# Patient Record
Sex: Male | Born: 1955 | Race: Black or African American | Hispanic: No | Marital: Married | Smoking: Never smoker
Health system: Southern US, Community
[De-identification: ages and names within clinical notes are randomized; demographics above are authoritative.]

## PROBLEM LIST (undated history)

## (undated) DIAGNOSIS — I10 Essential (primary) hypertension: Secondary | ICD-10-CM

## (undated) DIAGNOSIS — G4730 Sleep apnea, unspecified: Secondary | ICD-10-CM

## (undated) HISTORY — DX: Sleep apnea, unspecified: G47.30

---

## 1991-01-10 HISTORY — PX: APPENDECTOMY: SHX54

## 2002-02-28 ENCOUNTER — Emergency Department (HOSPITAL_COMMUNITY): Admission: EM | Admit: 2002-02-28 | Discharge: 2002-02-28 | Payer: Self-pay | Admitting: Emergency Medicine

## 2002-02-28 ENCOUNTER — Encounter: Payer: Self-pay | Admitting: Emergency Medicine

## 2002-09-13 ENCOUNTER — Emergency Department (HOSPITAL_COMMUNITY): Admission: EM | Admit: 2002-09-13 | Discharge: 2002-09-13 | Payer: Self-pay | Admitting: Emergency Medicine

## 2002-09-13 ENCOUNTER — Encounter: Payer: Self-pay | Admitting: Emergency Medicine

## 2004-02-01 ENCOUNTER — Observation Stay (HOSPITAL_COMMUNITY): Admission: EM | Admit: 2004-02-01 | Discharge: 2004-02-02 | Payer: Self-pay | Admitting: Emergency Medicine

## 2004-02-02 ENCOUNTER — Encounter (INDEPENDENT_AMBULATORY_CARE_PROVIDER_SITE_OTHER): Payer: Self-pay | Admitting: Cardiology

## 2005-06-19 ENCOUNTER — Emergency Department (HOSPITAL_COMMUNITY): Admission: EM | Admit: 2005-06-19 | Discharge: 2005-06-19 | Payer: Self-pay | Admitting: Emergency Medicine

## 2005-07-06 ENCOUNTER — Encounter: Admission: RE | Admit: 2005-07-06 | Discharge: 2005-07-06 | Payer: Self-pay | Admitting: Rheumatology

## 2007-10-25 ENCOUNTER — Inpatient Hospital Stay (HOSPITAL_COMMUNITY): Admission: EM | Admit: 2007-10-25 | Discharge: 2007-10-31 | Payer: Self-pay | Admitting: Emergency Medicine

## 2007-10-26 ENCOUNTER — Ambulatory Visit: Payer: Self-pay | Admitting: Internal Medicine

## 2007-11-11 ENCOUNTER — Inpatient Hospital Stay (HOSPITAL_COMMUNITY): Admission: EM | Admit: 2007-11-11 | Discharge: 2007-11-16 | Payer: Self-pay | Admitting: Emergency Medicine

## 2007-11-15 ENCOUNTER — Encounter (INDEPENDENT_AMBULATORY_CARE_PROVIDER_SITE_OTHER): Payer: Self-pay | Admitting: General Surgery

## 2008-01-10 HISTORY — PX: CHOLECYSTECTOMY: SHX55

## 2009-07-16 ENCOUNTER — Encounter (HOSPITAL_COMMUNITY): Admission: RE | Admit: 2009-07-16 | Discharge: 2009-09-15 | Payer: Self-pay | Admitting: Internal Medicine

## 2010-01-24 ENCOUNTER — Emergency Department (HOSPITAL_COMMUNITY)
Admission: EM | Admit: 2010-01-24 | Discharge: 2010-01-24 | Payer: Self-pay | Source: Home / Self Care | Admitting: Emergency Medicine

## 2010-05-24 NOTE — Op Note (Signed)
Jerome Pacheco, Jerome Pacheco                ACCOUNT NO.:  [id number]   MEDICAL RECORD NO.:  [id number]          PATIENT TYPE:  INP   LOCATION:  1521                         FACILITY:  [facility]   PHYSICIAN:  Wilhemina Bonito. Marina Goodell, MD      DATE OF BIRTH:  12/15/1955   DATE OF PROCEDURE:  10/27/2007  DATE OF DISCHARGE:                               OPERATIVE REPORT   PROCEDURE:  Endoscopic retrograde cholangiography.   INDICATION:  Biliary pancreatitis, question retained common bile duct  stone.   HISTORY:  This is a 55 year old gentleman who was admitted to the  hospital several days ago with acute abdominal pain.  Imaging revealed  gallstones.  Over the course of a few days, his liver tests rose and he  developed abdominal pain.  GI was consulted yesterday for possible ERCP.  After the consultation, blood work was obtained revealing markedly  elevated amylase and lipase compared to normal levels on admission.  He  was felt to have biliary pancreatitis.  It was not clear at that point  whether the patient had passed a stone or had retained stone.  Repeat  laboratories from this morning were improved though still abnormal with  a total bilirubin 3.7.  He is now for ERCP with possible sphincterotomy  and stone extraction.  The nature of  the procedure as well as the  risks, benefits, alternatives were reviewed several times with the  patient.  He understood and agreed to proceed.   PROCEDURE:  After informed consent was obtained, the patient was sedated  over the course of the exam with 100 mcg of fentanyl, 8 mg of Versed and  37.5 mg of Benadryl IV.  Glucagon 0.5 mg was given as a duodenal  relaxant.  Preoperative antibiotics in the form of the Unasyn were  continued.  The Pentax side-viewing endoscope was then passed blindly  into the esophagus.  The stomach was partially examined and appeared  unremarkable.  The duodenal bulb was unremarkable.  The postbulbar  duodenum was unremarkable.  The major  ampulla appeared somewhat bulbous  though it was not clear that there was an impacted stone.  Scout  radiograph of the abdomen with the endoscope was obtained.  No  significant abnormalities.  Thereafter, the ampulla was cannulated.  Throughout the course of the exam, the patient was combative.  With  increasing sedation, he had transient hypoxemia.  Multiple attempts were  made to cannulate the common bile duct.  However, due to a combination  of combativeness and difficulty with sedation, it was elected to abort  the case in favor of repeating the exam with propofol and general  anesthesia assistance.   IMPRESSION:  1. Biliary pancreatitis.  2. Cholelithiasis.  3. Question of retained common bile duct stone.  4. Aborted ERCP secondary to patient combativeness and difficulty with      sedation.   RECOMMENDATIONS:  1. Continue IV fluids and n.p.o.  2. Continue IV antibiotics.  3. Arrange for anesthesia assisted ERCP to be repeated in the morning.      Wilhemina Bonito. Marina Goodell, MD  Electronically  Signed     JNP/MEDQ  D:  10/27/2007  T:  10/27/2007  Job:  [id number]   cc:   Alfonse Ras, MD  [address], [address]  [place]  [place] [address]   Candyce Churn. Allyne Gee, M.D.  Fax: [phone]

## 2010-05-24 NOTE — H&P (Signed)
Jerome Pacheco, Jerome Pacheco                ACCOUNT NO.:  [id number]   MEDICAL RECORD NO.:  [id number]          PATIENT TYPE:  INP   LOCATION:  1535                         FACILITY:  [facility]   PHYSICIAN:  Thornton Park. Daphine Deutscher, MD  DATE OF BIRTH:  02-02-1955   DATE OF ADMISSION:  11/11/2007  DATE OF DISCHARGE:                              HISTORY & PHYSICAL   CHIEF COMPLAINT:  Recurrent abdominal pain consistent with biliary  pancreatitis.   HISTORY:  Mr. Masini is a 55 year old African American male who was  originally in pretty good health until about 3 weeks ago when he was  admitted to the [facility] ER with a bout that proved to be biliary  pancreatitis.  He went home and was in the process of cooling off,  awaiting for an elective cholecystectomy, and yesterday began having  recurrent abdominal pain that has been unremitting.  He presented to the  emergency room tonight where Dr. Weldon Inches evaluated him and obtained a  lipase which is greater than 2000 and liver function studies which are  significant in that his bilirubin is 5.1, alkaline phosphatase is 197,  SGPT is 277, SGOT of 276.  His white count is nonelevated at 6800 with  hemoglobin of 13 and no increase in his absolute granulocytes.   PAST MEDICAL HISTORY:  He has underlying hypertension.   PREVIOUS SURGERY:  Appendectomy.   SOCIAL HISTORY:  His wife accompanies him, and he is seen here in the ER  in the hall where my exam is limited via the lack of privacy.  The  patient does not drink alcohol or use drugs.   Has no known allergies.   PHYSICAL EXAMINATION:  Blood pressure 132/109, pulse rate of 100,  respirations 20, afebrile.  HEAD:  Normocephalic.  EYES:  Sclerae are somewhat muddy, but slight icterus.  HEENT:  Otherwise, unremarkable.  NECK:  Supple.  CHEST:  Clear.  HEART:  Sinus tachycardia.  ABDOMEN:  Tenderness, but no guarding.  The patient has been medicated.  Bowel sounds diminished.  EXTREMITIES:  Full  range of motion.   Gallbladder ultrasound showed gallstones, but with some wall thickening  which is elusive in the context of underlying pancreatitis.   PLAN:  Admit for IV hydration, n.p.o., and probable antibiotic coverage.  Possibly ordering an MRCP if needed.  The patient will be admitted to  Dr. Verdie Shire service to provide continuity since she took care of him in  his previous admission.      Thornton Park Daphine Deutscher, MD  Electronically Signed     MBM/MEDQ  D:  11/11/2007  T:  11/12/2007  Job:  [id number]

## 2010-05-24 NOTE — H&P (Signed)
NAMEKIZER, NOBBE                ACCOUNT NO.:  [id number]   MEDICAL RECORD NO.:  [id number]          PATIENT TYPE:  INP   LOCATION:  0102                         FACILITY:  [facility]   PHYSICIAN:  Almond Lint, MD       DATE OF BIRTH:  03/06/1955   DATE OF ADMISSION:  10/24/2007  DATE OF DISCHARGE:                              HISTORY & PHYSICAL   REFERRING PHYSICIAN:  Billee Cashing, M.D. in the ED.   CHIEF COMPLAINT:  Abdominal pain.   HISTORY OF PRESENT ILLNESS:  Mr. Lapaglia is a 55 year old male who was  awakened from sleep with abdominal pain.  This originally was in the  epigastric region.  He felt like he had to have a bowel movement, and  this was gas pain, but attempted to do so to no avail.  He denies  nausea.  He describes the pain as aching and cramping.  The pain did  start to shift towards the right upper quadrant as well as the  epigastrium.  Once he got to the emergency department, the pain began to  subside.  This was associated with some narcotics.  He is currently  painfree.   REVIEW OF SYSTEMS:  Otherwise negative x11 systems.   PAST MEDICAL HISTORY:  Significant for hypertension.   PAST SURGICAL HISTORY:  Appendectomy.   SOCIAL HISTORY:  He is accompanied by his wife and does not smoke, drink  alcohol, or use drugs.   MEDICATIONS:  None.   ALLERGIES:  None.   FAMILY HISTORY:  No significant medical problems that he is aware of.   PHYSICAL EXAMINATION:  Temperature 97.3, pulse 67, blood pressure  146/86, respiratory rate 18.  He is alert and oriented x3 in no acute distress.  HEENT:  Normocephalic and atraumatic.  NECK:  Supple with no lymphadenopathy.  HEART:  Regular.  ABDOMEN:  Soft, nontender, nondistended.  EXTREMITIES:  Warm and well perfused.   LABS:  Potassium is low at 2.9.  LFTs are elevated with bilirubin being  2.2.  AST and ALT elevated at 95 and 58.  Alk phos is 114.  Lipase is  34.  CBC:  White count is 5.4.  H&H 13.3 and 38.6.  UA  is negative.   Ultrasound report is reviewed and demonstrates a gallbladder with  cholelithiasis and gallbladder sludge.  The largest gallstone is 10 mm  in diameter.  There is no choledocholithiasis and no common bile duct  dilatation seen.   ASSESSMENT:  Mr. Perleberg is a 55 year old male with symptomatic  cholelithiasis and possible choledocholithiasis.  Labs were drawn at  11:30 on the 15th, so we will redraw labs in approximately 12 hours.  If  his LFTs are going up, we will refer him to GI medicine for an ERCP.  If  they are going down and he continues to be painfree, we will let him  eat, and if he is able to tolerate p.o., follow up with him for  outpatient cholecystectomy.  Patient understands the plan.      Almond Lint, MD  Electronically Signed  FB/MEDQ  D:  10/25/2007  T:  10/25/2007  Job:  [id number]

## 2010-05-24 NOTE — Op Note (Signed)
NAMEALEXIE, Jerome Pacheco                ACCOUNT NO.:  [id number]   MEDICAL RECORD NO.:  [id number]          PATIENT TYPE:  INP   LOCATION:  1535                         FACILITY:  [facility]   PHYSICIAN:  Almond Lint, MD       DATE OF BIRTH:  [birth year]   DATE OF PROCEDURE:  11/15/2007  DATE OF DISCHARGE:                               OPERATIVE REPORT   PREOPERATIVE DIAGNOSIS:  Gallstone pancreatitis and questionable  choledocholithiasis.   POSTOPERATIVE DIAGNOSIS:  Gallstone pancreatitis and questionable  choledocholithiasis.   PROCEDURE:  Laparoscopic cholecystectomy with attempted cholangiogram.   SURGEON:  Almond Lint, MD   ASSISTANT:  Alfonse Ras, MD   ANESTHESIA:  General and local.   FINDINGS:  Friable, inflamed cystic duct that tore with clip placement.   SPECIMEN:  Gallbladder to pathology.   ESTIMATED BLOOD LOSS:  25 mL.   COMPLICATIONS:  Cystic duct torn with clip placement, unable to perform  cholangiogram.   PROCEDURE IN DETAIL:  Jerome Pacheco was identified in the holding area and  taken to the operating room where he was placed supine on the operating  room table.  General anesthesia was induced.  His abdomen was prepped  and draped in a sterile fashion.  Time-out was performed according to  the surgical safety check list.  When all was correct we commenced the  procedure.  The infraumbilical skin was anesthetized with a mixture of  1% lidocaine plain and 0.25% Marcaine with epinephrine.  A transverse  curvilinear incision was made with a #11 blade.  The subcutaneous skin  was divided with the Kelly clamps and the umbilical stalk elevated with  a Kocher.  The fascia was grasped on either side of the midline with  Kocher clamps and entered sharply with the 11 blade.  A zero Vicryl was  placed in pursestring fashion around the fascial incision.  Hasson  trocar was advanced into the abdomen and pneumoperitoneum was achieved  to a pressure of 15 mmHg.  The patient  was placed into reverse  Trendelenburg and rotated to the left.  The gallbladder had a bit of  omentum stuck to it.  The epigastrium was examined and appropriate site  for port was placed.  Local anesthesia was used to numb the peritoneum  and the skin and an approximately 12 mm incision was made in the  epigastrium.  An 11 mm trocar was passed through abdominal wall under  direct visualization.  Two 5 mm ports were placed in the right upper  quadrant in a similar fashion.  The omentum was dissected off the  gallbladder and the liver edge.  The liver capsule tore somewhat but  there was no significant bleeding.  This was coagulated.  A grasper was  placed onto the fundus of the gallbladder and the gallbladder elevated  toward the head.  The infundibulum was grasped and retracted laterally.  [place] dissector was used to strip peritoneum from the infundibulum.  The cystic artery was immediately seen anteriorly.  This was dissected  free and entered high up on the gallbladder.  This was clipped  twice on  the patient's side, once on the specimen side.  The cystic duct was also  seen and this was skeletonized.  The peritoneum was skeletonized all the  way up halfway up the gallbladder.  The cystic duct was dilated but was  directly entering the gallbladder.  While placing a clip on the  gallbladder side in order to perform cholangiogram the cystic duct tore.  A clip was placed on the gallbladder to prevent significant biliary  leakage.  The cholangiogram catheter was advanced through the abdominal  wall and was attempted to be placed into the cystic duct stump.  This  would not pass.  There were two stones that were extracted from the  duct.  Still the cystic duct was unable to be cannulated.  Dr. Colin Benton  assisted with this portion and again it was felt that the continued  attempts were disturbing the integrity of the cystic duct stump.  An  Endoloop was used to ligate the cystic duct at the  junction of the  common bile duct.  Two clips were then placed distal to this suture and  the Endoloop clipped.  The gallbladder was then removed from the  gallbladder fossa with Bovie electrocautery.  The camera was moved to  the epigastric port and the EndoCatch bag was used to remove the  gallbladder from the umbilical port.  The Kelly clamp had to be used to  dilate the fascial incision just slightly to allow the gallbladder to  escape.  The gallbladder fossa and suprahepatic regions were irrigated  copiously.  Irrigant returned clear.  The gallbladder fossa was examined  as well as the sites of the clips on the cystic artery and cystic duct.  There was no biliary leakage seen and no bleeding.  Two liters of saline  were used to irrigate.  The epigastric and right upper quadrant ports  were removed under direct visualization and then pneumoperitoneum was  allowed to escape through the Hasson trocar.  Once pneumoperitoneum was  evacuated the pursestring suture at the umbilicus was used to close the  umbilical fascia.  This was palpated and there was no fascial defect  found.  The skin was then closed with a running Monocryl 4-0 in a  subcuticular fashion.  The skin was cleaned, dried and dressed with  Dermabond.  The patient was awakened from anesthesia and taken from the  operating room in stable condition.      Almond Lint, MD  Electronically Signed     FB/MEDQ  D:  11/15/2007  T:  11/15/2007  Job:  [id number]

## 2010-05-27 NOTE — Discharge Summary (Signed)
NAMEDEAN, Jerome Pacheco                ACCOUNT NO.:  [id number]   MEDICAL RECORD NO.:  [id number]          PATIENT TYPE:  INP   LOCATION:  6524                         FACILITY:  [facility]   PHYSICIAN:  Darden Palmer., M.D.DATE OF BIRTH:  07/04/1955   DATE OF ADMISSION:  DATE OF DISCHARGE:  02/02/2004                                 DISCHARGE SUMMARY   FINAL DIAGNOSES:  1.  Accelerated hypertension.  2.  Headache, resolved.  3.  Chest pain with atypical features, myocardial infarction ruled out.   PROCEDURES:  Echocardiogram.   HISTORY:  The patient was seen as an unassigned patient out of the emergency  room.  He has a history of hypertension, but does not take his medicine on a  regular basis.  His blood pressure was checked at his work site because he  was feeling poorly with headache, and found to be 156/101. He took medicine  and went home, but continued to have a headache, and eventually called Dr.  Allyne Gee' office, and eventually came to the emergency room. His blood  pressure was found to be 162/114.  He complained of some vague indigestion-  type pain like he had reflux.  The emergency room physician interpreted this  as cardiac pain, and gave him nitroglycerin.  The symptoms seemed to go  away.  He has no prior history of chest pain.  He states he had a stress  test within the past year.  He was admitted to the hospital to rule out a  myocardial infarction to the cardiology service.  Please see the previously  dictated history and physical for remainder of the details.   HOSPITAL COURSE:  Hemoglobin was 13.6, hematocrit 38.9, white count was  6000.  Chemistry panel showed a sodium of 137, potassium 3, chloride 102,  CO2 28, BUN 14, creatinine 1, glucose of 126.  Liver enzymes showed  elevation of alkaline phosphatase to 192.  Total CPK was elevated at 473  with MB of 4.9, troponin I was negative at 0.01.  This is thought to be  mostly due to muscle.  Lipid panel results  were pending at the time of  dictation.  TSH was also pending at the time of dictation.   The patient was admitted and begun on heparin, hypertension, and given  clonidine and his blood pressure came down. He had no further symptoms  following that.  When seen on rounds the next morning, he was symptom free,  and his blood pressure was better controlled.   He was given additional potassium for supplementation, and had an  echocardiogram showing concentric left ventricular hypertrophy with normal  systolic function.  Left atrium was borderline elevated.   He was placed on Norvasc.  He had improvement in his blood pressure, and I  felt he could be discharged home.  His EKG showed some T wave inversions in  the lateral leads which are nonspecific and could be consistent with LVH,  but also could be consistent with ischemia.  It is recommended that he be  discharged home.  The importance of good blood  pressure control is discussed  with him, including the need to take the medicines on a regular basis, as  well as the long-term risks of hypertension.  We specifically discussed the  blood pressure goal of 130/80.  He was instructed to monitor his blood  pressure at home.   DISCHARGE MEDICATIONS:  1.  Lisinopril 10 mg daily.  2.  HCTZ 12.5 mg daily.  3.  K-Dur 20 mEq daily.  4.  Aspirin 81 mg daily.  5.  Nitroglycerin sublingual p.r.n.  6.  Norvasc 5 mg daily.   DISCHARGE INSTRUCTIONS:  He is instructed to follow up with Dr. Candyce Churn.  Allyne Gee and is to also call my office to get an appointment for a stress  Cardiolite test to be done as an outpatient.  He is instructed to return to  the emergency room if he has recurrent chest pain.      WST/MEDQ  D:  02/02/2004  T:  02/03/2004  Job:  [id number]   cc:   Candyce Churn. Allyne Gee, M.D.  [address]  [address]  [place]  [place] [address]  Fax: [phone]

## 2010-05-27 NOTE — H&P (Signed)
NAMENASHON, ERBES                ACCOUNT NO.:  [id number]   MEDICAL RECORD NO.:  [id number]          PATIENT TYPE:  EMS   LOCATION:  MINO                         FACILITY:  [facility]   PHYSICIAN:  Ulyses Amor, MD DATE OF BIRTH:  09/03/1955   DATE OF ADMISSION:  02/01/2004  DATE OF DISCHARGE:                                HISTORY & PHYSICAL   REASON FOR ADMISSION:  Jerome Pacheco is a 55 year old black man who is  admitted to [facility] for further evaluation of chest  pain.   HISTORY OF PRESENT ILLNESS:  The patient initially presented to the  emergency department with a complaint of headache.  This began while he was  at work today.  He has a history of hypertension but has not been taking his  medication regularly.  His blood pressure was checked on site and was found  to be 156/101.  He took his medication and went home.  He continued to  experience a headache during the course of the afternoon and ultimately  presented to the emergency department.  Here, his blood pressure was found  to be 162/114.  While he was in the emergency department, he experienced an  episode of chest pain.  This was described as a substernal burning and  indigestion.  It did not radiate.  It was associated with nausea and  diaphoresis but no dyspnea.  There were no exacerbating or ameliorating  factors.  It appeared not to be related to position, activity, meals, or  respirations.  It was ultimately relieved in the emergency department with  one nitroglycerin tablet.  The total duration of chest pain was  approximately 10 minutes.   PAST MEDICAL HISTORY:  The patient has no past history of cardiac disease  including no history of chest pain, myocardial infarction, coronary artery  disease, congestive heart failure, or arrhythmia.  He has a number of risk  factors for coronary artery disease including hypertension, and a family  history of early coronary artery disease (his father  died of a myocardial  infarction at age 54).  There is no history of dyslipidemia or diabetes  mellitus.  There is no history of smoking.  The patient's headache has also  improved since presentation to the emergency department and reduction of his  blood pressure.   The patient has no other medical problems.   MEDICATIONS:  Lisinopril/hydrochlorothiazide 10/12.5 mg p.o. on an irregular  basis.   ALLERGIES:  None.   PAST SURGICAL HISTORY:  Appendectomy.  Significant injuries are none.   FAMILY HISTORY:  His father died at age 32 of a myocardial infarction as  described above.  His mother died in her 30s of unknown cancer.  He has six  siblings-one of his brothers has suffered a cerebrovascular accident.  There  were no other medical problems amongst his siblings.   SOCIAL HISTORY:  The patient lives with his wife.  He has two children.  He  works as an Airline pilot.  He does not smoke.  He drinks approximately one 12-  pack of beer per weekend.  REVIEW OF SYMPTOMS:  Reveals no problems related to his head, eye, nose,  mouth, throat, lungs, gastrointestinal system, genitourinary system, or  extremities.  There is no history of neurologic or psychiatric disorder.  There is no history of fever, chills, or weight loss.   PHYSICAL EXAMINATION:  VITAL SIGNS:  Blood pressure initially 116/114.  Most  recent blood pressure in the emergency department was 103/63.  Pulse  initially 91 and regular, now 74 and regular.  Respirations 20.  Temperature  98.4.  GENERAL:  The patient was an obese, middle-aged black man in no discomfort.  He was alert, oriented, appropriate, and responsive.  HEENT:  Normal.  Funduscopic examination revealed sharp disks.  NECK:  Without thyromegaly or adenopathy.  Carotid pulses were palpable  bilaterally without bruits.  CARDIOVASCULAR:  Normal S1 and S2.  There was no S3, S4, murmur, rub, or  click.  Cardiac rhythm was regular.  No chest wall tenderness was  noted.  LUNGS:  Clear.  ABDOMEN:  Soft and nontender.  There was no mass, hepatosplenomegaly, bruit,  distention, rebound, guarding, or rigidity.  Bowel sounds were normal.  RECTAL:  Not performed as they were not pertinent to the reason for acute  care hospitalization.  GENITAL:  Not performed as they were not pertinent to the reason for acute  care hospitalization.  EXTREMITIES:  Without edema, deviation or deformity.  Radial and dorsalis  pedis pulses were palpable bilaterally.  NEUROLOGICAL:  Brief screening neurologic survey was unremarkable.   LABORATORY DATA:  The electrocardiogram revealed mild T-wave inversion in  the inferolateral leads.  There were no other changes specific for ischemia  or infarction. The chest radiograph, according to the radiologist,  demonstrated cardiac enlargement and a tortuous aorta.   The initial set of cardiac markers revealed a CK-MB of 4.1, myoglobin of  180, troponin less than 0.05.  The second set revealed a CK-MB of 2.7,  myoglobin of 128 and troponin less than 0.05.  The third set revealed a CK-  MB of 3.5, myoglobin 106, and troponin less than 0.05.  Potassium is 3.0,  BUN 14, and creatinine 1.0.  White count was 8.4, with a hemoglobin of 15.9,  and hematocrit of 45.1.  The remaining studies were pending at the time of  this dictation.   IMPRESSION:  1.  Chest pain:  Rule out coronary artery disease.  The chest pain,      experienced while in the emergency department, was described as a      substernal burning or an indigestion.  It was relieved with      nitroglycerin.  Electrocardiogram revealed mild inferolateral T-wave      inversion.  2.  Hypertension:  Poorly controlled.  Noncompliance with blood pressure      medications.  This is the likely etiology of his presenting headache.   PLAN:  1.  Telemetry.  2.  Serial cardiac enzymes.  3.  Aspirin.  4.  Intravenous heparin. 5.  Intravenous nitroglycerin.  6.  Protonix.  7.   Restart blood pressure medications.  8.  Metoprolol.  9.  Fasting lipid profile.  10. Further measures per Dr. Lacretia Nicks. Viann Fish.      MSC/MEDQ  D:  02/01/2004  T:  02/01/2004  Job:  [id number]   cc:   Darden Palmer., M.D.  [address], [address]  [place]  [place] [address]  Fax: [phone]

## 2010-05-27 NOTE — Discharge Summary (Signed)
Jerome Pacheco, Jerome Pacheco                ACCOUNT NO.:  [id number]   MEDICAL RECORD NO.:  [id number]          PATIENT TYPE:  INP   LOCATION:  1535                         FACILITY:  [facility]   PHYSICIAN:  Almond Lint, MD       DATE OF BIRTH:  03-Oct-1955   DATE OF ADMISSION:  11/11/2007  DATE OF DISCHARGE:  11/16/2007                               DISCHARGE SUMMARY   PRINCIPAL DIAGNOSIS:  Cholecystitis and gallstone pancreatitis.   SECONDARY DIAGNOSES:  1. Choledocholithiasis.  2. Hypertension.   Principal Procedure:  Laparoscopic Cholecystectomy   HOSPITAL COURSE:  Mr. Tobler is a 55 year old male who was admitted on  November 11, 2007 for recurrent biliary pancreatitis.  He had been  scheduled for an office visit but did not make it to that time and had  recurrent severe abdominal pain, nausea and vomiting.  He was made NPO  and a time was found later that week for a cholecystectomy.  When he  went to the operating room for cholecystectomy he was pain-free.  In the  operating room he had a very friable cystic duct that tore when placing  a metal clip for the cholangiogram.  The  cystic duct was clearly  identified and clipped, however.  There did appear to be stone fragments  in the duct.  He postoperatively bumped his liver enzymes and  GI/Medicine was consulted.  He was a difficult attempt for ERCP the  previous time and so they elected, prior to performing an ERCP, to  perform an MRCP and to recheck his liver enzymes.  The next morning his  liver enzymes  came back down and  he did not get the MRCP due to  difficulties with timing in the MRI suite.  He was discharged in  improved condition.  He will follow with me in one to two weeks.   Discharge medications:  He was discharged to home with Clonidine Patch and Micardis 80/25.  He  also was given Percocet.      Almond Lint, MD  Electronically Signed     FB/MEDQ  D:  12/09/2007  T:  12/09/2007  Job:  [id number]

## 2010-05-27 NOTE — Discharge Summary (Signed)
NAMEKALMEN, LOLLAR                ACCOUNT NO.:  [id number]   MEDICAL RECORD NO.:  [id number]          PATIENT TYPE:  INP   LOCATION:  1521                         FACILITY:  [facility]   PHYSICIAN:  Almond Lint, MD       DATE OF BIRTH:  April 30, 1955   DATE OF ADMISSION:  10/24/2007  DATE OF DISCHARGE:  10/31/2007                               DISCHARGE SUMMARY   PRINCIPAL DIAGNOSIS:  Gallstone pancreatitis.   OTHER DIAGNOSES INCLUDE:  1. Hypertension.  2. Cholelithiasis.  3. Hypokalemia.  4. Nausea and vomiting.   PRINCIPAL PROCEDURES:  Attempted ERCP.   HOSPITAL COURSE:  Mr. Sweetin was seen in the emergency department for  pain that woke him from sleep.  He appeared to have choledocholithiasis  at this time, as his bilirubin was elevated to 2.2.  He did not have any  evidence of leukocytosis at this time or pancreatitis.  He was admitted  to recheck his LFTs.  These were elevated and GI medicine was consulted.  They attempted an ERCP; however, he was unable to tolerate it under  sedation.  His LFTs continued to go up and he underwent MRCP, which was  negative.  He wished [place] to undergo repeat attempts at ERCP under general  anesthesia.  His LFTs went up and additionally, he developed  pancreatitis on hospital day 3.  Despite the fact that this occurred,  his pancreatitis began to slowly improve.  He did not require an  additional ERCP because his LFTs began to decrease.  He was tolerating a  low-fat diet prior to discharge.  He was scheduled to follow up with me  in two to three weeks after being discharged.   HOME MEDICATIONS:  1. Micardis/hydrochlorothiazide 80/25 once a day.  2. Discharge medicines also include Vicodin one to two tabs p.o. every      8 hours p.r.n. pain.      Almond Lint, MD  Electronically Signed     FB/MEDQ  D:  11/22/2007  T:  11/22/2007  Job:  [id number]

## 2010-10-11 LAB — COMPREHENSIVE METABOLIC PANEL
ALT: 188 — ABNORMAL HIGH
ALT: 198 — ABNORMAL HIGH
ALT: 324 — ABNORMAL HIGH
ALT: 126 — ABNORMAL HIGH
AST: 21
AST: 258 — ABNORMAL HIGH
AST: 95 — ABNORMAL HIGH
AST: 276 — ABNORMAL HIGH
AST: 47 — ABNORMAL HIGH
Albumin: 3.1 — ABNORMAL LOW
Albumin: 3.6
Albumin: 3.7
Albumin: 3.4 — ABNORMAL LOW
Albumin: 3.8
Alkaline Phosphatase: 114
Alkaline Phosphatase: 129 — ABNORMAL HIGH
Alkaline Phosphatase: 139 — ABNORMAL HIGH
Alkaline Phosphatase: 158 — ABNORMAL HIGH
Alkaline Phosphatase: 183 — ABNORMAL HIGH
Alkaline Phosphatase: 218 — ABNORMAL HIGH
BUN: 4 — ABNORMAL LOW
BUN: 7
BUN: 8
BUN: 10
BUN: 12
BUN: 5 — ABNORMAL LOW
CO2: 24
CO2: 25
CO2: 26
CO2: 26
CO2: 25
CO2: 27
CO2: 27
CO2: 27
Calcium: 8.8
Calcium: 8.9
Calcium: 9
Calcium: 9.1
Calcium: 9.3
Chloride: 103
Chloride: 104
Chloride: 108
Chloride: 110
Chloride: 104
Chloride: 106
Chloride: 107
Creatinine, Ser: 1.02
Creatinine, Ser: 1.17
Creatinine, Ser: 1.02
Creatinine, Ser: 1.06
GFR calc Af Amer: >60
GFR calc Af Amer: >60
GFR calc Af Amer: >60
GFR calc Af Amer: >60
GFR calc Af Amer: >60
GFR calc Af Amer: >60
GFR calc Af Amer: >60
GFR calc non Af Amer: >60
GFR calc non Af Amer: >60
GFR calc non Af Amer: >60
GFR calc non Af Amer: >60
GFR calc non Af Amer: >60
GFR calc non Af Amer: >60
Glucose, Bld: 108 — ABNORMAL HIGH
Glucose, Bld: 111 — ABNORMAL HIGH
Glucose, Bld: 114 — ABNORMAL HIGH
Glucose, Bld: 120 — ABNORMAL HIGH
Glucose, Bld: 123 — ABNORMAL HIGH
Glucose, Bld: 129 — ABNORMAL HIGH
Glucose, Bld: 102 — ABNORMAL HIGH
Glucose, Bld: 107 — ABNORMAL HIGH
Potassium: 3.3 — ABNORMAL LOW
Potassium: 3.4 — ABNORMAL LOW
Potassium: 3.4 — ABNORMAL LOW
Potassium: 3.1 — ABNORMAL LOW
Potassium: 3.2 — ABNORMAL LOW
Potassium: 3.3 — ABNORMAL LOW
Sodium: 137
Sodium: 138
Sodium: 140
Sodium: 140
Sodium: 141
Sodium: 138
Total Bilirubin: 2.6 — ABNORMAL HIGH
Total Bilirubin: 3.4 — ABNORMAL HIGH
Total Bilirubin: 4.6 — ABNORMAL HIGH
Total Bilirubin: 1.3 — ABNORMAL HIGH
Total Bilirubin: 2.2 — ABNORMAL HIGH
Total Bilirubin: 5.1 — ABNORMAL HIGH
Total Protein: 5.8 — ABNORMAL LOW
Total Protein: 6
Total Protein: 6
Total Protein: 6.1
Total Protein: 6.2
Total Protein: 6.4
Total Protein: 6

## 2010-10-11 LAB — CBC
HCT: 36.6 — ABNORMAL LOW
HCT: 36.9 — ABNORMAL LOW
HCT: 36.9 — ABNORMAL LOW
HCT: 37.2 — ABNORMAL LOW
HCT: 34.9 — ABNORMAL LOW
HCT: 38 — ABNORMAL LOW
Hemoglobin: 12.4 — ABNORMAL LOW
Hemoglobin: 12.5 — ABNORMAL LOW
Hemoglobin: 12.6 — ABNORMAL LOW
Hemoglobin: 11.9 — ABNORMAL LOW
Hemoglobin: 12.2 — ABNORMAL LOW
MCHC: 34
MCHC: 34.3
MCHC: 34.8
MCHC: 34.3
MCHC: 34.4
MCV: 91
MCV: 91.6
MCV: 93.7
MCV: 90.3
MCV: 90.4
Platelets: 173
Platelets: 260
Platelets: 306
RBC: 3.94 — ABNORMAL LOW
RBC: 3.97 — ABNORMAL LOW
RBC: 3.97 — ABNORMAL LOW
RBC: 4.01 — ABNORMAL LOW
RBC: 4.12 — ABNORMAL LOW
RBC: 3.86 — ABNORMAL LOW
RBC: 3.86 — ABNORMAL LOW
RBC: 3.87 — ABNORMAL LOW
RDW: 13
RDW: 13.5
RDW: 13.6
RDW: 13.8
RDW: 13.9
RDW: 14
RDW: 13
RDW: 13
WBC: 3 — ABNORMAL LOW
WBC: 5.4
WBC: 5.8
WBC: 6.7
WBC: 7.3
WBC: 5.1

## 2010-10-11 LAB — HEPATIC FUNCTION PANEL
ALT: 262 — ABNORMAL HIGH
AST: 52 — ABNORMAL HIGH
Albumin: 3.4 — ABNORMAL LOW
Albumin: 2.9 — ABNORMAL LOW
Alkaline Phosphatase: 150 — ABNORMAL HIGH
Alkaline Phosphatase: 208 — ABNORMAL HIGH
Indirect Bilirubin: 1.4 — ABNORMAL HIGH
Total Bilirubin: 3.7 — ABNORMAL HIGH
Total Bilirubin: 1.6 — ABNORMAL HIGH
Total Protein: 5.9 — ABNORMAL LOW
Total Protein: 6.5

## 2010-10-11 LAB — URINALYSIS, ROUTINE W REFLEX MICROSCOPIC
Bilirubin Urine: NEGATIVE
Glucose, UA: NEGATIVE
Hgb urine dipstick: NEGATIVE
Protein, ur: NEGATIVE
Specific Gravity, Urine: 1.012
pH: 7

## 2010-10-11 LAB — DIFFERENTIAL
Basophils Absolute: 0
Basophils Absolute: 0
Basophils Relative: 0
Eosinophils Absolute: 0
Eosinophils Absolute: 0.1
Eosinophils Relative: 0
Eosinophils Relative: 1
Eosinophils Relative: 1
Lymphocytes Relative: 16
Lymphocytes Relative: 26
Lymphocytes Relative: 18
Lymphs Abs: 0.9
Lymphs Abs: 1.7
Lymphs Abs: 1.7
Lymphs Abs: 1.2
Monocytes Absolute: 0.3
Monocytes Relative: 7
Monocytes Relative: 9
Neutro Abs: 4.2
Neutro Abs: 3.1
Neutro Abs: 5
Neutrophils Relative %: 63
Neutrophils Relative %: 77

## 2010-10-11 LAB — PROTIME-INR
INR: 1.1
Prothrombin Time: 14

## 2010-10-11 LAB — LIPASE, BLOOD
Lipase: 124 — ABNORMAL HIGH
Lipase: 33
Lipase: 34
Lipase: 716 — ABNORMAL HIGH
Lipase: 92 — ABNORMAL HIGH
Lipase: 218 — ABNORMAL HIGH
Lipase: 38
Lipase: 431 — ABNORMAL HIGH
Lipase: >2000 — ABNORMAL HIGH

## 2010-10-11 LAB — AMYLASE
Amylase: 1402 — ABNORMAL HIGH
Amylase: 688 — ABNORMAL HIGH

## 2012-01-08 ENCOUNTER — Emergency Department (HOSPITAL_COMMUNITY)
Admission: EM | Admit: 2012-01-08 | Discharge: 2012-01-08 | Disposition: A | Discharge status: Home / Self Care | Attending: Emergency Medicine | Admitting: Emergency Medicine

## 2012-01-08 ENCOUNTER — Encounter (HOSPITAL_COMMUNITY): Payer: Self-pay | Admitting: *Deleted

## 2012-01-08 ENCOUNTER — Emergency Department (HOSPITAL_COMMUNITY)

## 2012-01-08 DIAGNOSIS — M25561 Pain in right knee: Secondary | ICD-10-CM

## 2012-01-08 DIAGNOSIS — Z79899 Other long term (current) drug therapy: Secondary | ICD-10-CM | POA: Insufficient documentation

## 2012-01-08 DIAGNOSIS — Z791 Long term (current) use of non-steroidal anti-inflammatories (NSAID): Secondary | ICD-10-CM | POA: Insufficient documentation

## 2012-01-08 DIAGNOSIS — I10 Essential (primary) hypertension: Secondary | ICD-10-CM | POA: Insufficient documentation

## 2012-01-08 DIAGNOSIS — M25569 Pain in unspecified knee: Secondary | ICD-10-CM | POA: Insufficient documentation

## 2012-01-08 HISTORY — DX: Essential (primary) hypertension: I10

## 2012-01-08 MED ORDER — OXYCODONE-ACETAMINOPHEN 5-325 MG PO TABS
2.0000 | ORAL_TABLET | Freq: Once | ORAL | Status: AC
  Administered 2012-01-08: 2 via ORAL
  Filled 2012-01-08: qty 2

## 2012-01-08 MED ORDER — OXYCODONE-ACETAMINOPHEN 5-325 MG PO TABS: 1.0000 | ORAL_TABLET | Freq: Four times a day (QID) | ORAL | Status: DC | PRN

## 2012-01-08 NOTE — ED Notes (Signed)
Pt states Friday he injured his R knee playing golf, unsure of exactly how he did it, states pain has worsened over the past couple days and now he can't bend it. Pt states has been putting heat on knee and taking motrin for pain.

## 2012-01-08 NOTE — ED Provider Notes (Signed)
History     CSN: [id number]  Arrival date & time 01/08/12  7829   First MD Initiated Contact with Patient 01/08/12 [phone]      Chief Complaint  Patient presents with  . Knee Pain    (Consider location/radiation/quality/duration/timing/severity/associated sxs/prior treatment) HPI Comments: Patient started with soreness in the knee three days ago, played golf two days ago and has worsened since that time.  It is now severe and he is having a hard time ambulating.  Patient is a 56 y.o. male presenting with knee pain. The history is provided by the patient.  Knee Pain This is a new problem. Episode onset: 2-3 days ago. The problem occurs constantly. The problem has been rapidly worsening. The symptoms are aggravated by walking (movement). The symptoms are relieved by rest. Treatments tried: nsaid, hydrocodone. The treatment provided no relief.    Past Medical History  Diagnosis Date  . Hypertension     Past Surgical History  Procedure Date  . Cholecystectomy   . Appendectomy     History reviewed. No pertinent family history.  History  Substance Use Topics  . Smoking status: Never Smoker   . Smokeless tobacco: Never Used  . Alcohol Use: No      Review of Systems  All other systems reviewed and are negative.    Allergies  Review of patient's allergies indicates no known allergies.  Home Medications   Current Outpatient Rx  Name  Route  Sig  Dispense  Refill  . AMLODIPINE BESYLATE 10 MG PO TABS   Oral   Take 10 mg by mouth at bedtime.         . IBUPROFEN 200 MG PO TABS   Oral   Take 800 mg by mouth every 8 (eight) hours as needed. For pain         . NABUMETONE 750 MG PO TABS   Oral   Take 750 mg by mouth 2 (two) times daily.         . TELMISARTAN-HCTZ 80-25 MG PO TABS   Oral   Take 1 tablet by mouth every morning.           BP 181/114  Pulse 95  Temp 97.7 F (36.5 C) (Oral)  Resp 24  SpO2 96%  Physical Exam  Nursing note and vitals  reviewed. Constitutional: He is oriented to person, place, and time. He appears well-developed and well-nourished. No distress.  HENT:  Head: Normocephalic and atraumatic.  Mouth/Throat: Oropharynx is clear and moist.  Neck: Normal range of motion. Neck supple.  Musculoskeletal:       The right knee is noted to have a small effusion.  There is ttp and pain with any range of motion.  It appears stable ap, lat to my exam that was limited by pain.  Neurological: He is alert and oriented to person, place, and time.  Skin: Skin is warm and dry. He is not diaphoretic.    ED Course  Procedures (including critical care time)  Labs Reviewed - No data to display No results found.   No diagnosis found.    MDM  The patient presents with right knee pain since playing golf.  There appears to be a small effusion present and the xray show pre-patellar soft tissue swelling.  He will be placed in a immobilizer, started on nsaids, pain meds, and follow up information for orthopedics will be given.          Geoffery Lyons, MD 01/08/12 [phone]

## 2012-01-08 NOTE — ED Notes (Signed)
Pt has not taken BP medication today, states he took some last night.

## 2012-01-08 NOTE — ED Notes (Signed)
Patient transported to X-ray 

## 2013-01-29 ENCOUNTER — Ambulatory Visit (INDEPENDENT_AMBULATORY_CARE_PROVIDER_SITE_OTHER): Payer: BC Managed Care – PPO | Admitting: Pulmonary Disease

## 2013-01-29 ENCOUNTER — Encounter: Payer: Self-pay | Admitting: Pulmonary Disease

## 2013-01-29 VITALS — BP 168/116 | HR 106 | Temp 98.3°F | Ht 64.5 in | Wt 213.8 lb

## 2013-01-29 DIAGNOSIS — G4733 Obstructive sleep apnea (adult) (pediatric): Secondary | ICD-10-CM

## 2013-01-29 NOTE — Assessment & Plan Note (Signed)
The patient has a history of moderate obstructive sleep apnea, but has not done well with CPAP in the past. He is clearly symptomatic with the night and during the day, and I also reviewed the possible negative impact on his cardiovascular health. The patient is willing to try CPAP again, and I've also encouraged him to work aggressively on weight loss. I will try him on the automatic setting to make it as comfortable as possible, and I have also reviewed various desensitization procedures. I will see him back in 8 weeks to check on his progress.

## 2013-01-29 NOTE — Patient Instructions (Signed)
Will arrange for your homecare company to get you new supplies, and set cpap on the automatic setting. Please call if you are having tolerance issues Try desensitization by wearing cpap for while awake in the family room Work on weight loss followup with me in 8 weeks, and bring your cpap machine with you.

## 2013-01-29 NOTE — Progress Notes (Signed)
Subjective:    Patient ID: Jerome Pacheco, male    DOB: [birth year], 58 y.o.   MRN: [id number]  HPI The patient is a 58 year old male who I've been asked to see for management of obstructive sleep apnea. He was diagnosed with moderate OSA in 2012, with an AHI of 28 events per hour. He was tried on CPAP, but had very poor tolerance because he "felt confined" he also felt that his mask leak at times. He has discontinued his CPAP, and now continues to be symptomatic at night and during the day. He has been noted to have loud snoring and an abnormal breathing pattern during sleep by his bed partner. He has frequent awakenings, but feels that he is reasonably rested upon awakening. He notes definite sleep pressure during the day and associated with decreased focus. He also will get sleepy in the evenings watching television or movies. He has no issues with sleepiness while driving except longer distances. The patient states that his weight is up 20-25 pounds over the last 2 years, and his Epworth score today is 15.   Sleep Questionnaire What time do you typically go to bed?( Between what hours) [phone] at 1111 on 01/29/13 by Maisie FusAshtyn M Green, CMA How long does it take you to fall asleep? 10mins 10mins at 1111 on 01/29/13 by Maisie FusAshtyn M Green, CMA How many times during the night do you wake up? 3 3 at 1111 on 01/29/13 by Maisie FusAshtyn M Green, CMA What time do you get out of bed to start your day? 0330 0330 at 1111 on 01/29/13 by Maisie FusAshtyn M Green, CMA Do you drive or operate heavy machinery in your occupation? No No at 1111 on 01/29/13 by Maisie FusAshtyn M Green, CMA How much has your weight changed (up or down) over the past two years? (In pounds) 25 lb (11.34 kg) 25 lb (11.34 kg) at 1111 on 01/29/13 by Maisie FusAshtyn M Green, CMA Have you ever had a sleep study before? Yes Yes at 1111 on 01/29/13 by Maisie FusAshtyn M Green, CMA If yes, location of study? If yes, date of study? 2012 2012 at 1111 on 01/29/13 by Maisie FusAshtyn M  Green, CMA Do you currently use CPAP? Yes Yes at 1111 on 01/29/13 by Maisie FusAshtyn M Green, CMA If so, what pressure? Do you wear oxygen at any time? No No at 1111 on 01/29/13 by Maisie FusAshtyn M Green, CMA   Review of Systems  Constitutional: Negative for fever and unexpected weight change.  HENT: Negative for congestion, dental problem, ear pain, nosebleeds, postnasal drip, rhinorrhea, sinus pressure, sneezing, sore throat and trouble swallowing.   Eyes: Negative for redness and itching.  Respiratory: Positive for cough and shortness of breath. Negative for chest tightness and wheezing.   Cardiovascular: Negative for palpitations and leg swelling.  Gastrointestinal: Negative for nausea and vomiting.       Heartburn  Genitourinary: Negative for dysuria.  Musculoskeletal: Positive for arthralgias and joint swelling.  Skin: Negative for rash.  Neurological: Negative for headaches.  Hematological: Does not bruise/bleed easily.  Psychiatric/Behavioral: Negative for dysphoric mood. The patient is not nervous/anxious.        Objective:   Physical Exam Constitutional:  Obese male, no acute distress  HENT:  Nares patent without discharge, but large turbinates bilat.   Oropharynx without exudate, palate and uvula are moderately elongated.  Eyes:  Perrla, eomi, no scleral icterus  Neck:  No JVD, no TMG  Cardiovascular:  Normal rate, regular rhythm, no rubs or gallops.  No  murmurs        Intact distal pulses  Pulmonary :  Normal breath sounds, no stridor or respiratory distress   No rales, rhonchi, or wheezing  Abdominal:  Soft, nondistended, bowel sounds present.  No tenderness noted.   Musculoskeletal:  No lower extremity edema noted.  Lymph Nodes:  No cervical lymphadenopathy noted  Skin:  No cyanosis noted  Neurologic:  Alert, appropriate, moves all 4 extremities without obvious deficit.         Assessment & Plan:

## 2013-03-26 ENCOUNTER — Ambulatory Visit: Payer: BC Managed Care – PPO | Admitting: Pulmonary Disease

## 2013-04-16 ENCOUNTER — Ambulatory Visit (INDEPENDENT_AMBULATORY_CARE_PROVIDER_SITE_OTHER): Payer: BC Managed Care – PPO | Admitting: Pulmonary Disease

## 2013-04-16 ENCOUNTER — Telehealth: Payer: Self-pay | Admitting: Pulmonary Disease

## 2013-04-16 ENCOUNTER — Encounter: Payer: Self-pay | Admitting: Pulmonary Disease

## 2013-04-16 VITALS — BP 140/98 | HR 60 | Temp 98.0°F | Ht 64.5 in | Wt 211.0 lb

## 2013-04-16 DIAGNOSIS — G4733 Obstructive sleep apnea (adult) (pediatric): Secondary | ICD-10-CM

## 2013-04-16 NOTE — Telephone Encounter (Signed)
[facility] on AUTO but the pt machine is not capable of this so they are asking for an order to be sent for a loaner machine so the pt can be set on Auto. The original order placed is asking for the pt to be st on auto and left there. So I am not sure that a loaner machine is what is needed.  [facility]. She states the pt could have a loaner machine for 2 weeks on auto setting in order for us to get a download but he cannot have a loaner machine for an extended period of time. The machine that the pt has was ordered by Dr. Park BreedKahn in 2012 and the type of machines he orders are not auto capable. The pt got this machine in 2012 so in order to get a new machine he would have to pay out of pocket for it.   I do not see that a download was requested so I am not sure if ordering the pt a loaner machine is what is needed. I advised Melissa [facility] will [facility] and let her know.  Dr. Shelle Ironlance please advise if you want to get the pt a loaner machine to set on auto for a few weeks with a download? Attempt to get him a new machine through insurance (not sure this is possible)? Please advise. Carron CurieJennifer Inas Avena, CMA

## 2013-04-16 NOTE — Assessment & Plan Note (Signed)
The patient is doing much better with CPAP, and feels that it has helped his sleep and daytime alertness. He still has not had his pressure optimized, nor a card placed in his device despite being ordered in January. We will send this again to his home care company, but will have to switch if the service continues to be an issue.  I've asked the patient stay on his CPAP, and work aggressively on weight loss.

## 2013-04-16 NOTE — Progress Notes (Signed)
   Subjective:    Patient ID: Jerome Pacheco Cutter, male    DOB: 1955-02-14, 58 y.o.   MRN: [id number]  HPI The patient comes in today for followup of his obstructive sleep apnea. He is been wearing CPAP compliantly, and feels that it is helping his sleep and daytime alertness. He is not having any issues with mask tolerance, but does feel that it is an aggravation. He never had his machine put on auto, nor did he get a card for his device as ordered from [facility] at the last visit.   Review of Systems  Constitutional: Negative for fever and unexpected weight change.  HENT: Negative for congestion, dental problem, ear pain, nosebleeds, postnasal drip, rhinorrhea, sinus pressure, sneezing, sore throat and trouble swallowing.   Eyes: Negative for redness and itching.  Respiratory: Negative for cough, chest tightness, shortness of breath and wheezing.   Cardiovascular: Negative for palpitations and leg swelling.  Gastrointestinal: Negative for nausea and vomiting.  Genitourinary: Negative for dysuria.  Musculoskeletal: Negative for joint swelling.  Skin: Negative for rash.  Neurological: Negative for headaches.  Hematological: Does not bruise/bleed easily.  Psychiatric/Behavioral: Negative for dysphoric mood. The patient is not nervous/anxious.        Objective:   Physical Exam Overweight male in no acute distress Nose without purulence or discharge noted No skin breakdown or pressure necrosis from the CPAP mask Neck without lymphadenopathy or thyromegaly Lower extremities without edema, no cyanosis Alert, does not appear to be sleepy, moves all 4 extremities.        Assessment & Plan:

## 2013-04-16 NOTE — Patient Instructions (Signed)
Will send another order to your homecare company to get your machine on auto, and get an SD card for your machine. Keep up with mask changes and supplies. Work on weight loss followup with me again in one year if you are doing well, but call if having issues.

## 2013-04-17 NOTE — Telephone Encounter (Signed)
So we order auto in Jan and his homecare company says we will just ignore the order because he doesn't have auto on the machine????  Let them know this is terrible service and I expect more. He needs order to have loaner auto for 2 weeks 5-20cm with download to me, and needs SD card for his machine.  If his machine is in no way downloadable, then we need to get him a new machine.  Let me know

## 2013-04-17 NOTE — Telephone Encounter (Signed)
LMTCB for Jerome Pacheco  

## 2013-04-17 NOTE — Telephone Encounter (Signed)
[facility] and she states she will have to check to make sure his current machine is downloadable and will let us know. I have placed the order for him to get a loaner machine set on auto. Pt already has an SD card for his machine.  Carron CurieJennifer Castillo, CMA

## 2013-05-01 ENCOUNTER — Telehealth: Payer: Self-pay | Admitting: Pulmonary Disease

## 2013-05-01 NOTE — Telephone Encounter (Signed)
lmomtcb x1 on cell and home # to make aware [facility] needs to [facility]

## 2013-05-02 NOTE — Telephone Encounter (Signed)
LMOMTCB x 2 cell and home # to make [facility] aware [facility]. [facility] # and contact name Barbara CowerJason.

## 2013-05-05 NOTE — Telephone Encounter (Signed)
Spoke with patient-he did receive the messages from our office regarding [facility] and plans to call them now. He was given the number(and ext) to [facility] [place] [facility]. Pt is aware this is in regards to CPAP and download for pressure needed. Will sign off on message.

## 2013-06-11 ENCOUNTER — Telehealth: Payer: Self-pay | Admitting: Pulmonary Disease

## 2013-06-11 ENCOUNTER — Encounter: Payer: Self-pay | Admitting: Pulmonary Disease

## 2013-06-11 ENCOUNTER — Ambulatory Visit (INDEPENDENT_AMBULATORY_CARE_PROVIDER_SITE_OTHER): Payer: BC Managed Care – PPO | Admitting: Pulmonary Disease

## 2013-06-11 VITALS — BP 160/100 | HR 56 | Temp 98.2°F | Ht 64.0 in | Wt 212.0 lb

## 2013-06-11 DIAGNOSIS — G4733 Obstructive sleep apnea (adult) (pediatric): Secondary | ICD-10-CM

## 2013-06-11 NOTE — Assessment & Plan Note (Signed)
The patient has been wearing CPAP compliantly, and her recent auto titration study showed his optimal pressure to be 15 cm of water. We will have his machine set on this level, and I've encouraged him to keep up with his mask changes and supplies. I've also encouraged him to work aggressively on weight loss.

## 2013-06-11 NOTE — Telephone Encounter (Signed)
Per [facility], Pt brought CPAP in for D/L today-- has a copy of D/L, bringing to appt today with [facility] @12p 

## 2013-06-11 NOTE — Progress Notes (Signed)
   Subjective:    Patient ID: Jerome Pacheco, male    DOB: 1955-07-18, 58 y.o.   MRN: [id number]  HPI Patient comes in today for followup of his obstructive sleep apnea. He is been wearing CPAP compliantly, and has had a recent two-week auto titration study which shows an optimal pressure of 15 cm of water. He had no significant mask leaks, and excellent control of his AHI. I have reviewed the download with him, and answered all of his questions.   Review of Systems  Constitutional: Negative for fever and unexpected weight change.  HENT: Negative for congestion, dental problem, ear pain, nosebleeds, postnasal drip, rhinorrhea, sinus pressure, sneezing, sore throat and trouble swallowing.   Eyes: Negative for redness and itching.  Respiratory: Negative for cough, chest tightness, shortness of breath and wheezing.   Cardiovascular: Negative for palpitations and leg swelling.  Gastrointestinal: Negative for nausea and vomiting.  Genitourinary: Negative for dysuria.  Musculoskeletal: Negative for joint swelling.  Skin: Negative for rash.  Neurological: Negative for headaches.  Hematological: Does not bruise/bleed easily.  Psychiatric/Behavioral: Negative for dysphoric mood. The patient is not nervous/anxious.        Objective:   Physical Exam Overweight male in no acute distress Nose without purulence or discharge noted Neck without lymphadenopathy or thyromegaly No skin breakdown or pressure necrosis from the CPAP mask Lower extremities without edema, no cyanosis Alert and oriented, moves all 4 extremities.       Assessment & Plan:

## 2013-06-11 NOTE — Patient Instructions (Signed)
Will put your machine on a pressure of 15cm.  Let us know if having issues. Work on weight loss followup with me again in one year if doing well.

## 2013-06-19 ENCOUNTER — Telehealth: Payer: Self-pay | Admitting: Pulmonary Disease

## 2013-06-19 NOTE — Telephone Encounter (Signed)
lmomtcb x1 for pt to make aware [facility] calling

## 2013-06-20 NOTE — Telephone Encounter (Signed)
lmomtcb x 2  

## 2013-06-23 NOTE — Telephone Encounter (Signed)
Spoke with pt-- [facility] and [facility] up CPAP machine on Friday afternoon  Nothing further needed.

## 2014-04-17 ENCOUNTER — Ambulatory Visit: Payer: BC Managed Care – PPO | Admitting: Pulmonary Disease

## 2014-06-12 ENCOUNTER — Ambulatory Visit: Payer: BC Managed Care – PPO | Admitting: Pulmonary Disease

## 2015-12-30 LAB — HM COLONOSCOPY

## 2016-11-27 ENCOUNTER — Emergency Department (HOSPITAL_COMMUNITY): Payer: BC Managed Care – PPO

## 2016-11-27 ENCOUNTER — Emergency Department (HOSPITAL_COMMUNITY)
Admission: EM | Admit: 2016-11-27 | Discharge: 2016-11-27 | Disposition: A | Discharge status: Home / Self Care | Payer: BC Managed Care – PPO | Attending: Emergency Medicine | Admitting: Emergency Medicine

## 2016-11-27 ENCOUNTER — Other Ambulatory Visit: Payer: Self-pay

## 2016-11-27 ENCOUNTER — Encounter (HOSPITAL_COMMUNITY): Payer: Self-pay | Admitting: Emergency Medicine

## 2016-11-27 DIAGNOSIS — Z5321 Procedure and treatment not carried out due to patient leaving prior to being seen by health care provider: Secondary | ICD-10-CM | POA: Insufficient documentation

## 2016-11-27 DIAGNOSIS — R0789 Other chest pain: Secondary | ICD-10-CM | POA: Diagnosis present

## 2016-11-27 LAB — BASIC METABOLIC PANEL
Anion gap: 9 (ref 5–15)
BUN: 18 mg/dL (ref 6–20)
CALCIUM: 9 mg/dL (ref 8.9–10.3)
CO2: 25 mmol/L (ref 22–32)
CREATININE: 1.26 mg/dL — AB (ref 0.61–1.24)
Chloride: 106 mmol/L (ref 101–111)
GFR calc Af Amer: >60 mL/min (ref >60)
GFR calc non Af Amer: 60 mL/min — ABNORMAL LOW (ref >60)
GLUCOSE: 139 mg/dL — AB (ref 65–99)
Potassium: 3.2 mmol/L — ABNORMAL LOW (ref 3.5–5.1)
Sodium: 140 mmol/L (ref 135–145)

## 2016-11-27 LAB — CBC
HCT: 39.7 % (ref 39.0–52.0)
Hemoglobin: 14.2 g/dL (ref 13.0–17.0)
MCH: 30.8 pg (ref 26.0–34.0)
MCHC: 35.8 g/dL (ref 30.0–36.0)
MCV: 86.1 fL (ref 78.0–100.0)
PLATELETS: 227 10*3/uL (ref 150–400)
RBC: 4.61 MIL/uL (ref 4.22–5.81)
RDW: 13 % (ref 11.5–15.5)
WBC: 6 10*3/uL (ref 4.0–10.5)

## 2016-11-27 LAB — I-STAT TROPONIN, ED: TROPONIN I, POC: 0 ng/mL (ref 0.00–0.08)

## 2016-11-27 NOTE — ED Notes (Signed)
Patient at the desk.  States I feel better and I don't wan't to wait any longer.  Encouraged to stay without success.

## 2016-11-27 NOTE — ED Triage Notes (Signed)
Reports waking from sleep with central chest pressure.  Thought it was heartburn so he took an acid reducer then tried tonic water with no relief.

## 2017-02-01 ENCOUNTER — Other Ambulatory Visit: Payer: Self-pay | Admitting: Internal Medicine

## 2017-02-01 DIAGNOSIS — N6320 Unspecified lump in the left breast, unspecified quadrant: Secondary | ICD-10-CM

## 2017-02-15 ENCOUNTER — Ambulatory Visit
Admission: RE | Admit: 2017-02-15 | Discharge: 2017-02-15 | Disposition: A | Discharge status: Home / Self Care | Payer: BC Managed Care – PPO | Source: Ambulatory Visit | Attending: Internal Medicine | Admitting: Internal Medicine

## 2017-02-15 DIAGNOSIS — N6320 Unspecified lump in the left breast, unspecified quadrant: Secondary | ICD-10-CM

## 2017-07-31 DIAGNOSIS — B360 Pityriasis versicolor: Secondary | ICD-10-CM | POA: Insufficient documentation

## 2017-07-31 DIAGNOSIS — I10 Essential (primary) hypertension: Secondary | ICD-10-CM | POA: Insufficient documentation

## 2017-07-31 LAB — BASIC METABOLIC PANEL
BUN: 18 (ref 4–21)
Creatinine: 1.1 (ref 0.6–1.3)
GLUCOSE: 84
POTASSIUM: 3.7 (ref 3.4–5.3)
Sodium: 141 (ref 137–147)

## 2017-07-31 LAB — LIPID PANEL
CHOLESTEROL: 145 (ref 0–200)
HDL: 44 (ref 35–70)
LDL Cholesterol: 80
LDl/HDL Ratio: 1.8
Triglycerides: 107 (ref 40–160)

## 2017-07-31 LAB — HEPATIC FUNCTION PANEL: ALK PHOS: 71 (ref 25–125)

## 2017-07-31 LAB — HEMOGLOBIN A1C: HEMOGLOBIN A1C: 5.4

## 2017-10-06 ENCOUNTER — Encounter: Payer: Self-pay | Admitting: Internal Medicine

## 2017-10-06 DIAGNOSIS — I10 Essential (primary) hypertension: Secondary | ICD-10-CM

## 2017-10-06 DIAGNOSIS — B360 Pityriasis versicolor: Secondary | ICD-10-CM

## 2017-10-10 ENCOUNTER — Encounter: Payer: Self-pay | Admitting: Internal Medicine

## 2017-10-10 ENCOUNTER — Ambulatory Visit (INDEPENDENT_AMBULATORY_CARE_PROVIDER_SITE_OTHER): Payer: BC Managed Care – PPO | Admitting: Internal Medicine

## 2017-10-10 VITALS — BP 126/82 | HR 74 | Temp 97.8°F | Ht 64.5 in | Wt 202.4 lb

## 2017-10-10 DIAGNOSIS — E669 Obesity, unspecified: Secondary | ICD-10-CM

## 2017-10-10 DIAGNOSIS — Z6834 Body mass index (BMI) 34.0-34.9, adult: Secondary | ICD-10-CM | POA: Diagnosis not present

## 2017-10-10 DIAGNOSIS — Z23 Encounter for immunization: Secondary | ICD-10-CM

## 2017-10-10 DIAGNOSIS — I10 Essential (primary) hypertension: Secondary | ICD-10-CM | POA: Diagnosis not present

## 2017-10-10 NOTE — Progress Notes (Signed)
  Subjective:     Patient ID: Jerome Pacheco , male    DOB: August 14, 1955 , 62 y.o.   MRN: [id number]   Hypertension  This is a chronic problem. The current episode started more than 1 year ago. The problem has been gradually improving since onset. The problem is controlled. Pertinent negatives include no chest pain, headaches, malaise/fatigue, peripheral edema or shortness of breath. Risk factors for coronary artery disease include male gender and obesity.     Past Medical History:  Diagnosis Date  . Hypertension   . Sleep apnea       Current Outpatient Medications:  .  amLODipine (NORVASC) 10 MG tablet, Take 10 mg by mouth at bedtime., Disp: , Rfl:  .  carvedilol (COREG) 12.5 MG tablet, Take 1 tablet by mouth., Disp: , Rfl:  .  colchicine (COLCRYS) 0.6 MG tablet, Take 0.6 mg by mouth daily., Disp: , Rfl:  .  hydrochlorothiazide (HYDRODIURIL) 25 MG tablet, Take 25 mg by mouth daily., Disp: , Rfl:  .  ibuprofen (ADVIL,MOTRIN) 200 MG tablet, Take 800 mg by mouth every 8 (eight) hours as needed. For pain, Disp: , Rfl:  .  nabumetone (RELAFEN) 750 MG tablet, Take 750 mg by mouth 2 (two) times daily., Disp: , Rfl:  .  olmesartan (BENICAR) 40 MG tablet, Take 40 mg by mouth daily., Disp: , Rfl:  .  POTASSIUM CHLORIDE ER PO, Take by mouth., Disp: , Rfl:  .  spironolactone (ALDACTONE) 50 MG tablet, Take 50 mg by mouth daily., Disp: , Rfl:    Review of Systems  Constitutional: Negative.  Negative for malaise/fatigue.  HENT: Negative.   Respiratory: Negative.  Negative for shortness of breath.   Cardiovascular: Negative.  Negative for chest pain.  Gastrointestinal: Negative.   Musculoskeletal: Negative.   Neurological: Negative for headaches.  Psychiatric/Behavioral: Negative.      Today's Vitals   10/10/17 1456  BP: 126/82  Pulse: 74  Temp: 97.8 F (36.6 C)  TempSrc: Oral  Weight: 202 lb 6.4 oz (91.8 kg)  Height: 5' 4.5" (1.638 m)   Body mass index is 34.21 kg/m.   Objective:   Physical Exam  Constitutional: He is oriented to person, place, and time. He appears well-developed and well-nourished.  Neck: Normal range of motion. Neck supple.  Cardiovascular: Normal rate, regular rhythm, normal heart sounds and normal pulses.  Pulmonary/Chest: Effort normal and breath sounds normal.  Neurological: He is alert and oriented to person, place, and time.  Skin: Skin is warm and dry.  Psychiatric: He has a normal mood and affect. His behavior is normal.  Nursing note and vitals reviewed.       Assessment And Plan:      Essential hypertension, malignant - WELL CONTROLLED. HE WILL CONTINUE WITH CURRENT MEDS.   Need for influenza vaccination - HE WAS GIVEN FLU VACCINE.  - Plan: Flu Vaccine QUAD 6+ mos PF IM (Fluarix Quad PF)  Adult BMI 34.0-34.9 kg/sq m - HE WILL RESUME SAXENDA. HE IS ENCOURAGED TO EXERCISE NO LESS THAN FIVE DAYS WEEKLY (AT LEAST 30 MINUTES) AND TO AVOID SUGARY BEVERAGES AND PROCESSED FOODS.   Obesity (BMI 30.0-34.9)  HE IS ENCOURAGED TO STRIVE FOR BMI LESS THAN 30 TO DECREASE CARDIAC RISK.   Gwynneth Aliment, MD Gwynneth Aliment, MD

## 2017-10-14 ENCOUNTER — Encounter: Payer: Self-pay | Admitting: Internal Medicine

## 2017-12-13 ENCOUNTER — Ambulatory Visit (INDEPENDENT_AMBULATORY_CARE_PROVIDER_SITE_OTHER): Payer: BC Managed Care – PPO | Admitting: Internal Medicine

## 2017-12-13 ENCOUNTER — Encounter: Payer: Self-pay | Admitting: Internal Medicine

## 2017-12-13 VITALS — BP 126/90 | HR 80 | Temp 98.7°F | Ht 64.5 in | Wt 210.2 lb

## 2017-12-13 DIAGNOSIS — Z6835 Body mass index (BMI) 35.0-35.9, adult: Secondary | ICD-10-CM

## 2017-12-13 DIAGNOSIS — I10 Essential (primary) hypertension: Secondary | ICD-10-CM

## 2017-12-13 NOTE — Patient Instructions (Signed)
DASH Eating Plan DASH stands for "Dietary Approaches to Stop Hypertension." The DASH eating plan is a healthy eating plan that has been shown to reduce high blood pressure (hypertension). It may also reduce your risk for type 2 diabetes, heart disease, and stroke. The DASH eating plan may also help with weight loss. What are tips for following this plan? General guidelines  Avoid eating more than 2,300 mg (milligrams) of salt (sodium) a day. If you have hypertension, you may need to reduce your sodium intake to 1,500 mg a day.  Limit alcohol intake to no more than 1 drink a day for nonpregnant women and 2 drinks a day for men. One drink equals 12 oz of beer, 5 oz of wine, or 1 oz of hard liquor.  Work with your health care provider to maintain a healthy body weight or to lose weight. Ask what an ideal weight is for you.  Get at least 30 minutes of exercise that causes your heart to beat faster (aerobic exercise) most days of the week. Activities may include walking, swimming, or biking.  Work with your health care provider or diet and nutrition specialist (dietitian) to adjust your eating plan to your individual calorie needs. Reading food labels  Check food labels for the amount of sodium per serving. Choose foods with less than 5 percent of the Daily Value of sodium. Generally, foods with less than 300 mg of sodium per serving fit into this eating plan.  To find whole grains, look for the word "whole" as the first word in the ingredient list. Shopping  Buy products labeled as "low-sodium" or "no salt added."  Buy fresh foods. Avoid canned foods and premade or frozen meals. Cooking  Avoid adding salt when cooking. Use salt-free seasonings or herbs instead of table salt or sea salt. Check with your health care provider or pharmacist before using salt substitutes.  Do not fry foods. Cook foods using healthy methods such as baking, boiling, grilling, and broiling instead.  Cook with  heart-healthy oils, such as olive, canola, soybean, or sunflower oil. Meal planning   Eat a balanced diet that includes: ? 5 or more servings of fruits and vegetables each day. At each meal, try to fill half of your plate with fruits and vegetables. ? Up to 6-8 servings of whole grains each day. ? Less than 6 oz of lean meat, poultry, or fish each day. A 3-oz serving of meat is about the same size as a deck of cards. One egg equals 1 oz. ? 2 servings of low-fat dairy each day. ? A serving of nuts, seeds, or beans 5 times each week. ? Heart-healthy fats. Healthy fats called Omega-3 fatty acids are found in foods such as flaxseeds and coldwater fish, like sardines, salmon, and mackerel.  Limit how much you eat of the following: ? Canned or prepackaged foods. ? Food that is high in trans fat, such as fried foods. ? Food that is high in saturated fat, such as fatty meat. ? Sweets, desserts, sugary drinks, and other foods with added sugar. ? Full-fat dairy products.  Do not salt foods before eating.  Try to eat at least 2 vegetarian meals each week.  Eat more home-cooked food and less restaurant, buffet, and fast food.  When eating at a restaurant, ask that your food be prepared with less salt or no salt, if possible. What foods are recommended? The items listed may not be a complete list. Talk with your dietitian about what   dietary choices are best for you. Grains Whole-grain or whole-wheat bread. Whole-grain or whole-wheat pasta. Brown rice. Oatmeal. Quinoa. Bulgur. Whole-grain and low-sodium cereals. Pita bread. Low-fat, low-sodium crackers. Whole-wheat flour tortillas. Vegetables Fresh or frozen vegetables (raw, steamed, roasted, or grilled). Low-sodium or reduced-sodium tomato and vegetable juice. Low-sodium or reduced-sodium tomato sauce and tomato paste. Low-sodium or reduced-sodium canned vegetables. Fruits All fresh, dried, or frozen fruit. Canned fruit in natural juice (without  added sugar). Meat and other protein foods Skinless chicken or turkey. Ground chicken or turkey. Pork with fat trimmed off. Fish and seafood. Egg whites. Dried beans, peas, or lentils. Unsalted nuts, nut butters, and seeds. Unsalted canned beans. Lean cuts of beef with fat trimmed off. Low-sodium, lean deli meat. Dairy Low-fat (1%) or fat-free (skim) milk. Fat-free, low-fat, or reduced-fat cheeses. Nonfat, low-sodium ricotta or cottage cheese. Low-fat or nonfat yogurt. Low-fat, low-sodium cheese. Fats and oils Soft margarine without trans fats. Vegetable oil. Low-fat, reduced-fat, or light mayonnaise and salad dressings (reduced-sodium). Canola, safflower, olive, soybean, and sunflower oils. Avocado. Seasoning and other foods Herbs. Spices. Seasoning mixes without salt. Unsalted popcorn and pretzels. Fat-free sweets. What foods are not recommended? The items listed may not be a complete list. Talk with your dietitian about what dietary choices are best for you. Grains Baked goods made with fat, such as croissants, muffins, or some breads. Dry pasta or rice meal packs. Vegetables Creamed or fried vegetables. Vegetables in a cheese sauce. Regular canned vegetables (not low-sodium or reduced-sodium). Regular canned tomato sauce and paste (not low-sodium or reduced-sodium). Regular tomato and vegetable juice (not low-sodium or reduced-sodium). Pickles. Olives. Fruits Canned fruit in a light or heavy syrup. Fried fruit. Fruit in cream or butter sauce. Meat and other protein foods Fatty cuts of meat. Ribs. Fried meat. Bacon. Sausage. Bologna and other processed lunch meats. Salami. Fatback. Hotdogs. Bratwurst. Salted nuts and seeds. Canned beans with added salt. Canned or smoked fish. Whole eggs or egg yolks. Chicken or turkey with skin. Dairy Whole or 2% milk, cream, and half-and-half. Whole or full-fat cream cheese. Whole-fat or sweetened yogurt. Full-fat cheese. Nondairy creamers. Whipped toppings.  Processed cheese and cheese spreads. Fats and oils Butter. Stick margarine. Lard. Shortening. Ghee. Bacon fat. Tropical oils, such as coconut, palm kernel, or palm oil. Seasoning and other foods Salted popcorn and pretzels. Onion salt, garlic salt, seasoned salt, table salt, and sea salt. Worcestershire sauce. Tartar sauce. Barbecue sauce. Teriyaki sauce. Soy sauce, including reduced-sodium. Steak sauce. Canned and packaged gravies. Fish sauce. Oyster sauce. Cocktail sauce. Horseradish that you find on the shelf. Ketchup. Mustard. Meat flavorings and tenderizers. Bouillon cubes. Hot sauce and Tabasco sauce. Premade or packaged marinades. Premade or packaged taco seasonings. Relishes. Regular salad dressings. Where to find more information:  National Heart, Lung, and Blood Institute: www.nhlbi.nih.gov  American Heart Association: www.heart.org Summary  The DASH eating plan is a healthy eating plan that has been shown to reduce high blood pressure (hypertension). It may also reduce your risk for type 2 diabetes, heart disease, and stroke.  With the DASH eating plan, you should limit salt (sodium) intake to 2,300 mg a day. If you have hypertension, you may need to reduce your sodium intake to 1,500 mg a day.  When on the DASH eating plan, aim to eat more fresh fruits and vegetables, whole grains, lean proteins, low-fat dairy, and heart-healthy fats.  Work with your health care provider or diet and nutrition specialist (dietitian) to adjust your eating plan to your individual   calorie needs. This information is not intended to replace advice given to you by your health care provider. Make sure you discuss any questions you have with your health care provider. Document Released: 12/15/2010 Document Revised: 12/20/2015 Document Reviewed: 12/20/2015 Elsevier Interactive Patient Education  2018 Elsevier Inc.  

## 2017-12-18 ENCOUNTER — Other Ambulatory Visit: Payer: Self-pay | Admitting: Internal Medicine

## 2017-12-23 NOTE — Progress Notes (Signed)
Subjective:     Patient ID: Jerome Pacheco , male    DOB: Feb 17, 1955 , 62 y.o.   MRN: [id number]   Chief Complaint  Patient presents with  . Hypertension  . Obesity    HPI  Hypertension  This is a chronic problem. The current episode started more than 1 year ago. The problem has been gradually improving since onset. The problem is controlled. Risk factors for coronary artery disease include obesity and stress. There are no compliance problems.     Obesity  He is no longer using Saxenda. He got tired of sticking himself once daily. He is still exercising regularly.   Past Medical History:  Diagnosis Date  . Hypertension   . Sleep apnea      Family History  Problem Relation Age of Onset  . Heart disease Father   . Cancer Mother      Current Outpatient Medications:  .  amLODipine (NORVASC) 10 MG tablet, Take 10 mg by mouth at bedtime., Disp: , Rfl:  .  carvedilol (COREG) 12.5 MG tablet, Take 1 tablet by mouth., Disp: , Rfl:  .  colchicine (COLCRYS) 0.6 MG tablet, Take 0.6 mg by mouth daily., Disp: , Rfl:  .  hydrochlorothiazide (HYDRODIURIL) 25 MG tablet, Take 25 mg by mouth daily., Disp: , Rfl:  .  ibuprofen (ADVIL,MOTRIN) 200 MG tablet, Take 800 mg by mouth every 8 (eight) hours as needed. For pain, Disp: , Rfl:  .  nabumetone (RELAFEN) 750 MG tablet, Take 750 mg by mouth 2 (two) times daily., Disp: , Rfl:  .  olmesartan (BENICAR) 40 MG tablet, Take 40 mg by mouth daily., Disp: , Rfl:  .  POTASSIUM CHLORIDE ER PO, Take by mouth., Disp: , Rfl:  .  spironolactone (ALDACTONE) 50 MG tablet, TAKE 1 TABLET BY MOUTH ONCE DAILY, Disp: 90 tablet, Rfl: 1   No Known Allergies   Review of Systems  Constitutional: Negative.   Respiratory: Negative.   Cardiovascular: Negative.   Neurological: Negative.      Today's Vitals   12/13/17 1511  BP: 126/90  Pulse: 80  Temp: 98.7 F (37.1 C)  Weight: 210 lb 3.2 oz (95.3 kg)  Height: 5' 4.5" (1.638 m)   Body mass index is 35.52  kg/m.   Objective:  Physical Exam Vitals signs and nursing note reviewed.  Constitutional:      Appearance: Normal appearance. He is obese.  HENT:     Head: Normocephalic and atraumatic.  Cardiovascular:     Rate and Rhythm: Normal rate and regular rhythm.  Pulmonary:     Effort: Pulmonary effort is normal.     Breath sounds: Normal breath sounds.  Skin:    General: Skin is warm and dry.  Neurological:     General: No focal deficit present.     Mental Status: He is alert.  Psychiatric:        Mood and Affect: Mood normal.         Assessment And Plan:     1. Essential hypertension, benign  Fair control. He will continue with current meds for now.  He is encouraged to avoid adding salt to his foods. He will rto in 3 months for re-evaluation.   2. Class 2 severe obesity due to excess calories with serious comorbidity and body mass index (BMI) of 35.0 to 35.9 in adult [facility](HCC)  He is encouraged to strive for BMI less than 30 to decrease cardiac risk. He is encouraged to continue exercising  at least five days weekly.   Gwynneth Aliment, MD

## 2018-02-25 ENCOUNTER — Encounter: Payer: Self-pay | Admitting: Internal Medicine

## 2018-02-25 ENCOUNTER — Other Ambulatory Visit: Payer: Self-pay

## 2018-02-25 ENCOUNTER — Ambulatory Visit (INDEPENDENT_AMBULATORY_CARE_PROVIDER_SITE_OTHER): Payer: BC Managed Care – PPO | Admitting: Internal Medicine

## 2018-02-25 VITALS — BP 158/98 | HR 70 | Temp 97.7°F | Ht 64.2 in | Wt 213.6 lb

## 2018-02-25 DIAGNOSIS — Z1211 Encounter for screening for malignant neoplasm of colon: Secondary | ICD-10-CM | POA: Diagnosis not present

## 2018-02-25 DIAGNOSIS — I10 Essential (primary) hypertension: Secondary | ICD-10-CM

## 2018-02-25 DIAGNOSIS — Z0000 Encounter for general adult medical examination without abnormal findings: Secondary | ICD-10-CM | POA: Diagnosis not present

## 2018-02-25 DIAGNOSIS — B360 Pityriasis versicolor: Secondary | ICD-10-CM

## 2018-02-25 LAB — POCT URINALYSIS DIPSTICK
BILIRUBIN UA: NEGATIVE
Glucose, UA: NEGATIVE
Ketones, UA: NEGATIVE
LEUKOCYTES UA: NEGATIVE
Nitrite, UA: NEGATIVE
PH UA: 6 (ref 5.0–8.0)
Protein, UA: POSITIVE — AB
Spec Grav, UA: 1.025 (ref 1.010–1.025)
UROBILINOGEN UA: 1 U/dL

## 2018-02-25 LAB — POC HEMOCCULT BLD/STL (OFFICE/1-CARD/DIAGNOSTIC)
Card #1 Date: 6302020
Fecal Occult Blood, POC: NEGATIVE

## 2018-02-25 LAB — POCT UA - MICROALBUMIN
CREATININE, POC: 200 mg/dL
MICROALBUMIN (UR) POC: 150 mg/L

## 2018-02-25 NOTE — Progress Notes (Signed)
Subjective:     Patient ID: Jerome Pacheco , male    DOB: Mar 01, 1955 , 63 y.o.   MRN: [id number]   Chief Complaint  Patient presents with  . Annual Exam  . Hypertension    HPI  He is here today for a full physical examination.  He has no specific concerns at this time. However, states his wife is concerned about "splotches" on his chest.   Hypertension  This is a chronic problem. The current episode started more than 1 year ago. The problem has been gradually improving since onset. The problem is controlled. Pertinent negatives include no blurred vision, chest pain, palpitations or shortness of breath. Risk factors for coronary artery disease include male gender and obesity.   He admits that he missed his meds over the weekend.   Past Medical History:  Diagnosis Date  . Hypertension   . Sleep apnea      Family History  Problem Relation Age of Onset  . Heart disease Father   . Cancer Mother      Current Outpatient Medications:  .  amLODipine (NORVASC) 10 MG tablet, Take 10 mg by mouth at bedtime., Disp: , Rfl:  .  carvedilol (COREG) 12.5 MG tablet, Take 1 tablet by mouth., Disp: , Rfl:  .  colchicine (COLCRYS) 0.6 MG tablet, Take 0.6 mg by mouth daily., Disp: , Rfl:  .  hydrochlorothiazide (HYDRODIURIL) 25 MG tablet, Take 25 mg by mouth daily., Disp: , Rfl:  .  ibuprofen (ADVIL,MOTRIN) 200 MG tablet, Take 800 mg by mouth every 8 (eight) hours as needed. For pain, Disp: , Rfl:  .  nabumetone (RELAFEN) 750 MG tablet, Take 750 mg by mouth 2 (two) times daily., Disp: , Rfl:  .  olmesartan (BENICAR) 40 MG tablet, Take 40 mg by mouth daily., Disp: , Rfl:  .  POTASSIUM CHLORIDE ER PO, Take by mouth., Disp: , Rfl:  .  spironolactone (ALDACTONE) 50 MG tablet, TAKE 1 TABLET BY MOUTH ONCE DAILY, Disp: 90 tablet, Rfl: 1   No Known Allergies   Men's preventive visit. Patient Health Questionnaire (PHQ-2) is    Office Visit from 02/25/2018 in [facility]  PHQ-2  Total Score  0    . Patient is on a low salt diet. Marital status: Married. Relevant history for alcohol use is:  Social History   Substance and Sexual Activity  Alcohol Use No   Comment: quit 2012  . Relevant history for tobacco use is:  Social History   Tobacco Use  Smoking Status Never Smoker  Smokeless Tobacco Never Used  .  Review of Systems  Constitutional: Negative.   HENT: Negative.   Eyes: Negative.  Negative for blurred vision.  Respiratory: Negative.  Negative for shortness of breath.   Cardiovascular: Negative.  Negative for chest pain and palpitations.  Gastrointestinal: Negative.   Endocrine: Negative.   Genitourinary: Negative.   Musculoskeletal: Negative.   Skin: Positive for rash.  Allergic/Immunologic: Negative.   Neurological: Negative.   Hematological: Negative.   Psychiatric/Behavioral: Negative.      Today's Vitals   02/25/18 0855  BP: (!) 158/98  Pulse: 70  Temp: 97.7 F (36.5 C)  TempSrc: Oral  SpO2: 96%  Weight: 213 lb 9.6 oz (96.9 kg)  Height: 5' 4.2" (1.631 m)   Body mass index is 36.44 kg/m.   Objective:  Physical Exam Vitals signs and nursing note reviewed.  Constitutional:      Appearance: Normal appearance. He is obese.  HENT:     Head: Normocephalic and atraumatic.     Right Ear: Tympanic membrane, ear canal and external ear normal.     Left Ear: Tympanic membrane, ear canal and external ear normal.     Nose: Nose normal.     Mouth/Throat:     Mouth: Mucous membranes are moist.     Pharynx: Oropharynx is clear.  Eyes:     Extraocular Movements: Extraocular movements intact.     Conjunctiva/sclera: Conjunctivae normal.     Pupils: Pupils are equal, round, and reactive to light.  Neck:     Musculoskeletal: Normal range of motion and neck supple.  Cardiovascular:     Rate and Rhythm: Normal rate and regular rhythm.     Pulses: Normal pulses.     Heart sounds: Normal heart sounds.  Pulmonary:     Effort: Pulmonary  effort is normal.     Breath sounds: Normal breath sounds.  Chest:     Breasts:        Right: Normal. No swelling, bleeding, inverted nipple, mass, nipple discharge or skin change.        Left: Normal. No swelling, bleeding, inverted nipple, mass, nipple discharge or skin change.  Abdominal:     General: Bowel sounds are normal.     Palpations: Abdomen is soft.  Genitourinary:    Prostate: Normal.     Rectum: Normal. Guaiac result negative.  Musculoskeletal: Normal range of motion.  Skin:    General: Skin is warm and dry.     Capillary Refill: Capillary refill takes less than 2 seconds.     Comments: Hypopigmented areas at base of neck and upper back. Hyperpigmented areas underneath bilateral breasts. No vesicular lesions noted.   Neurological:     General: No focal deficit present.     Mental Status: He is alert and oriented to person, place, and time.  Psychiatric:        Mood and Affect: Mood normal.         Assessment And Plan:     1. Routine general medical examination at health care facility  A full exam was performed. DRE performed, stool heme negative. PATIENT HAS BEEN ADVISED TO GET 30-45 MINUTES REGULAR EXERCISE NO LESS THAN FOUR TO FIVE DAYS PER WEEK - BOTH WEIGHTBEARING EXERCISES AND AEROBIC ARE RECOMMENDED.  HE IS ADVISED TO FOLLOW A HEALTHY DIET WITH AT LEAST SIX FRUITS/VEGGIES PER DAY, DECREASE INTAKE OF RED MEAT, AND TO INCREASE FISH INTAKE TO TWO DAYS PER WEEK.  MEATS/FISH SHOULD NOT BE FRIED, BAKED OR BROILED IS PREFERABLE.  I SUGGEST WEARING SPF 50 SUNSCREEN ON EXPOSED PARTS AND ESPECIALLY WHEN IN THE DIRECT SUNLIGHT FOR AN EXTENDED PERIOD OF TIME.  PLEASE AVOID FAST FOOD RESTAURANTS AND INCREASE YOUR WATER INTAKE.  - CMP14+EGFR - CBC - Lipid panel - Hemoglobin A1c - HIV antibody (with reflex)  2. Essential hypertension, benign  Uncontrolled. He admits that he did not take meds over the weekend. He promises to try to be more diligent on the weekends. He will  continue with current meds for now. He will rto in 3 months for re-evaluation. EKG performed, no new changes noted.   - EKG 12-Lead  3. Tinea versicolor  He was advised to use OTC Selsun Blue to affected area. He is advised to lather, let sit for 10 minutes and then rinse.  If persistent, then I will prescribe prescription strength.  Maximino Greenland, MD

## 2018-02-25 NOTE — Addendum Note (Signed)
Addended by: Mariam Dollar on: 02/25/2018 12:53 PM   Modules accepted: Orders

## 2018-02-25 NOTE — Patient Instructions (Signed)

## 2018-02-26 LAB — CMP14+EGFR
ALBUMIN: 4.6 g/dL (ref 3.8–4.8)
ALT: 23 IU/L (ref 0–44)
AST: 21 IU/L (ref 0–40)
Albumin/Globulin Ratio: 1.8 (ref 1.2–2.2)
Alkaline Phosphatase: 70 IU/L (ref 39–117)
BUN / CREAT RATIO: 15 (ref 10–24)
BUN: 17 mg/dL (ref 8–27)
Bilirubin Total: 1.4 mg/dL — ABNORMAL HIGH (ref 0.0–1.2)
CALCIUM: 9.7 mg/dL (ref 8.6–10.2)
CO2: 23 mmol/L (ref 20–29)
CREATININE: 1.17 mg/dL (ref 0.76–1.27)
Chloride: 103 mmol/L (ref 96–106)
GFR calc Af Amer: 77 mL/min/{1.73_m2} (ref >59)
GFR, EST NON AFRICAN AMERICAN: 66 mL/min/{1.73_m2} (ref >59)
GLOBULIN, TOTAL: 2.5 g/dL (ref 1.5–4.5)
Glucose: 116 mg/dL — ABNORMAL HIGH (ref 65–99)
Potassium: 3.5 mmol/L (ref 3.5–5.2)
SODIUM: 143 mmol/L (ref 134–144)
TOTAL PROTEIN: 7.1 g/dL (ref 6.0–8.5)

## 2018-02-26 LAB — CBC
Hematocrit: 41.2 % (ref 37.5–51.0)
Hemoglobin: 14.3 g/dL (ref 13.0–17.7)
MCH: 30.4 pg (ref 26.6–33.0)
MCHC: 34.7 g/dL (ref 31.5–35.7)
MCV: 88 fL (ref 79–97)
PLATELETS: 133 10*3/uL — AB (ref 150–450)
RBC: 4.71 x10E6/uL (ref 4.14–5.80)
RDW: 14 % (ref 11.6–15.4)
WBC: 5.4 10*3/uL (ref 3.4–10.8)

## 2018-02-26 LAB — LIPID PANEL
Chol/HDL Ratio: 3.7 ratio (ref 0.0–5.0)
Cholesterol, Total: 167 mg/dL (ref 100–199)
HDL: 45 mg/dL (ref >39)
LDL Calculated: 99 mg/dL (ref 0–99)
Triglycerides: 116 mg/dL (ref 0–149)
VLDL CHOLESTEROL CAL: 23 mg/dL (ref 5–40)

## 2018-02-26 LAB — HIV ANTIBODY (ROUTINE TESTING W REFLEX): HIV Screen 4th Generation wRfx: NONREACTIVE

## 2018-02-26 LAB — HEMOGLOBIN A1C
ESTIMATED AVERAGE GLUCOSE: 117 mg/dL
HEMOGLOBIN A1C: 5.7 % — AB (ref 4.8–5.6)

## 2018-03-04 LAB — SPECIMEN STATUS REPORT

## 2018-03-04 LAB — PSA: Prostate Specific Ag, Serum: 0.9 ng/mL (ref 0.0–4.0)

## 2018-03-13 ENCOUNTER — Other Ambulatory Visit: Payer: Self-pay | Admitting: Internal Medicine

## 2018-03-18 ENCOUNTER — Other Ambulatory Visit: Payer: Self-pay | Admitting: Internal Medicine

## 2018-04-29 ENCOUNTER — Other Ambulatory Visit: Payer: Self-pay | Admitting: Internal Medicine

## 2018-05-27 ENCOUNTER — Other Ambulatory Visit: Payer: Self-pay

## 2018-05-27 ENCOUNTER — Encounter: Payer: Self-pay | Admitting: Internal Medicine

## 2018-05-27 ENCOUNTER — Ambulatory Visit (INDEPENDENT_AMBULATORY_CARE_PROVIDER_SITE_OTHER): Payer: BC Managed Care – PPO | Admitting: Internal Medicine

## 2018-05-27 VITALS — BP 146/90 | HR 71 | Temp 98.3°F | Ht 64.2 in | Wt 206.6 lb

## 2018-05-27 DIAGNOSIS — I10 Essential (primary) hypertension: Secondary | ICD-10-CM

## 2018-05-27 DIAGNOSIS — Z6835 Body mass index (BMI) 35.0-35.9, adult: Secondary | ICD-10-CM

## 2018-05-27 MED ORDER — SPIRONOLACTONE 50 MG PO TABS: 50.0000 mg | ORAL_TABLET | Freq: Every day | ORAL | 1 refills | Status: DC

## 2018-05-27 MED ORDER — CARVEDILOL 12.5 MG PO TABS: 12.5000 mg | ORAL_TABLET | Freq: Two times a day (BID) | ORAL | 1 refills | Status: DC

## 2018-05-27 MED ORDER — HYDROCHLOROTHIAZIDE 25 MG PO TABS: 25.0000 mg | ORAL_TABLET | Freq: Every day | ORAL | 0 refills | Status: DC

## 2018-05-27 NOTE — Progress Notes (Signed)
Subjective:     Patient ID: Jerome Pacheco , male    DOB: Dec 18, 1955 , 63 y.o.   MRN: [id number]   Chief Complaint  Patient presents with  . Hypertension    HPI  He is here today for bp check. Thinks his blood pressure would have been better, but his boss called him just before his appt. He denies headaches, chest pain and sob. He does report compliance with meds. He has been working from home due to COVID-19 pandemic. He admits that he is working much harder from home. He has no new concerns.     Past Medical History:  Diagnosis Date  . Hypertension   . Sleep apnea      Family History  Problem Relation Age of Onset  . Heart disease Father   . Cancer Mother      Current Outpatient Medications:  .  amLODipine (NORVASC) 10 MG tablet, Take 1 tablet by mouth once daily, Disp: 90 tablet, Rfl: 0 .  carvedilol (COREG) 12.5 MG tablet, Take 1 tablet (12.5 mg total) by mouth 2 (two) times daily., Disp: 180 tablet, Rfl: 1 .  hydrochlorothiazide (HYDRODIURIL) 25 MG tablet, Take 1 tablet (25 mg total) by mouth daily., Disp: 90 tablet, Rfl: 0 .  ibuprofen (ADVIL,MOTRIN) 200 MG tablet, Take 800 mg by mouth every 8 (eight) hours as needed. For pain, Disp: , Rfl:  .  nabumetone (RELAFEN) 750 MG tablet, Take 750 mg by mouth 2 (two) times daily., Disp: , Rfl:  .  POTASSIUM CHLORIDE ER PO, Take by mouth., Disp: , Rfl:  .  spironolactone (ALDACTONE) 50 MG tablet, Take 1 tablet (50 mg total) by mouth daily., Disp: 90 tablet, Rfl: 1 .  olmesartan (BENICAR) 40 MG tablet, Take 40 mg by mouth daily., Disp: , Rfl:    No Known Allergies   Review of Systems  Constitutional: Negative.   Respiratory: Negative.   Cardiovascular: Negative.   Gastrointestinal: Negative.   Neurological: Negative.   Psychiatric/Behavioral: Negative.      Today's Vitals   05/27/18 0917  BP: (!) 146/90  Pulse: 71  Temp: 98.3 F (36.8 C)  TempSrc: Oral  Weight: 206 lb 9.6 oz (93.7 kg)  Height: 5' 4.2" (1.631 m)    Body mass index is 35.24 kg/m.   Objective:  Physical Exam Vitals signs and nursing note reviewed.  Constitutional:      Appearance: Normal appearance.  Cardiovascular:     Rate and Rhythm: Normal rate and regular rhythm.     Heart sounds: Normal heart sounds.  Pulmonary:     Effort: Pulmonary effort is normal.     Breath sounds: Normal breath sounds.  Skin:    General: Skin is warm.  Neurological:     General: No focal deficit present.     Mental Status: He is alert.  Psychiatric:        Mood and Affect: Mood normal.         Assessment And Plan:     1. Essential hypertension, benign  Uncontrolled. I will increase his carvedilol to BID dosing. He will rto in six weeks for re-evaluation. He is encouraged to exercise no less than five days per week.   2. Class 2 severe obesity due to excess calories with serious comorbidity and body mass index (BMI) of 35.0 to 35.9 in adult [facility](HCC)  He was congratulated on his 7 pound weight loss in the past several months. He is encouraged to keep up the great work.  He is encouraged to exercise no less than five days/30 minutes per week.   Gwynneth Aliment, MD    THE PATIENT IS ENCOURAGED TO PRACTICE SOCIAL DISTANCING DUE TO THE COVID-19 PANDEMIC.

## 2018-05-27 NOTE — Patient Instructions (Signed)

## 2018-07-01 ENCOUNTER — Telehealth: Payer: Self-pay | Admitting: Internal Medicine

## 2018-07-01 NOTE — Telephone Encounter (Signed)
PA STARTED RXV#[id number] FOR SAXENDA

## 2018-07-01 NOTE — Telephone Encounter (Signed)
PA NOT NEEDED.

## 2018-07-11 ENCOUNTER — Encounter: Payer: Self-pay | Admitting: Internal Medicine

## 2018-07-11 ENCOUNTER — Ambulatory Visit (INDEPENDENT_AMBULATORY_CARE_PROVIDER_SITE_OTHER): Payer: BC Managed Care – PPO | Admitting: Internal Medicine

## 2018-07-11 ENCOUNTER — Other Ambulatory Visit: Payer: Self-pay

## 2018-07-11 VITALS — BP 160/90 | HR 88 | Temp 98.2°F | Ht 64.8 in | Wt 207.4 lb

## 2018-07-11 DIAGNOSIS — I10 Essential (primary) hypertension: Secondary | ICD-10-CM | POA: Diagnosis not present

## 2018-07-11 MED ORDER — OLMESARTAN-AMLODIPINE-HCTZ 40-10-25 MG PO TABS: 1.0000 | ORAL_TABLET | Freq: Every morning | ORAL | 1 refills | Status: DC

## 2018-07-11 NOTE — Patient Instructions (Signed)
Start Magnesium - supplement with Calm nightly   Managing Your Hypertension Hypertension is commonly called high blood pressure. This is when the force of your blood pressing against the walls of your arteries is too strong. Arteries are blood vessels that carry blood from your heart throughout your body. Hypertension forces the heart to work harder to pump blood, and may cause the arteries to become narrow or stiff. Having untreated or uncontrolled hypertension can cause heart attack, stroke, kidney disease, and other problems. What are blood pressure readings? A blood pressure reading consists of a higher number over a lower number. Ideally, your blood pressure should be below 120/80. The first ("top") number is called the systolic pressure. It is a measure of the pressure in your arteries as your heart beats. The second ("bottom") number is called the diastolic pressure. It is a measure of the pressure in your arteries as the heart relaxes. What does my blood pressure reading mean? Blood pressure is classified into four stages. Based on your blood pressure reading, your health care provider may use the following stages to determine what type of treatment you need, if any. Systolic pressure and diastolic pressure are measured in a unit called mm Hg. Normal  Systolic pressure: below 093.  Diastolic pressure: below 80. Elevated  Systolic pressure: 235-573.  Diastolic pressure: below 80. Hypertension stage 1  Systolic pressure: 220-254.  Diastolic pressure: 27-06. Hypertension stage 2  Systolic pressure: 237 or above.  Diastolic pressure: 90 or above. What health risks are associated with hypertension? Managing your hypertension is an important responsibility. Uncontrolled hypertension can lead to:  A heart attack.  A stroke.  A weakened blood vessel (aneurysm).  Heart failure.  Kidney damage.  Eye damage.  Metabolic syndrome.  Memory and concentration problems. What  changes can I make to manage my hypertension? Hypertension can be managed by making lifestyle changes and possibly by taking medicines. Your health care provider will help you make a plan to bring your blood pressure within a normal range. Eating and drinking   Eat a diet that is high in fiber and potassium, and low in salt (sodium), added sugar, and fat. An example eating plan is called the DASH (Dietary Approaches to Stop Hypertension) diet. To eat this way: ? Eat plenty of fresh fruits and vegetables. Try to fill half of your plate at each meal with fruits and vegetables. ? Eat whole grains, such as whole wheat pasta, brown rice, or whole grain bread. Fill about one quarter of your plate with whole grains. ? Eat low-fat diary products. ? Avoid fatty cuts of meat, processed or cured meats, and poultry with skin. Fill about one quarter of your plate with lean proteins such as fish, chicken without skin, beans, eggs, and tofu. ? Avoid premade and processed foods. These tend to be higher in sodium, added sugar, and fat.  Reduce your daily sodium intake. Most people with hypertension should eat less than 1,500 mg of sodium a day.  Limit alcohol intake to no more than 1 drink a day for nonpregnant women and 2 drinks a day for men. One drink equals 12 oz of beer, 5 oz of wine, or 1 oz of hard liquor. Lifestyle  Work with your health care provider to maintain a healthy body weight, or to lose weight. Ask what an ideal weight is for you.  Get at least 30 minutes of exercise that causes your heart to beat faster (aerobic exercise) most days of the week. Activities  may include walking, swimming, or biking.  Include exercise to strengthen your muscles (resistance exercise), such as weight lifting, as part of your weekly exercise routine. Try to do these types of exercises for 30 minutes at least 3 days a week.  Do not use any products that contain nicotine or tobacco, such as cigarettes and  e-cigarettes. If you need help quitting, ask your health care provider.  Control any long-term (chronic) conditions you have, such as high cholesterol or diabetes. Monitoring  Monitor your blood pressure at home as told by your health care provider. Your personal target blood pressure may vary depending on your medical conditions, your age, and other factors.  Have your blood pressure checked regularly, as often as told by your health care provider. Working with your health care provider  Review all the medicines you take with your health care provider because there may be side effects or interactions.  Talk with your health care provider about your diet, exercise habits, and other lifestyle factors that may be contributing to hypertension.  Visit your health care provider regularly. Your health care provider can help you create and adjust your plan for managing hypertension. Will I need medicine to control my blood pressure? Your health care provider may prescribe medicine if lifestyle changes are not enough to get your blood pressure under control, and if:  Your systolic blood pressure is 130 or higher.  Your diastolic blood pressure is 80 or higher. Take medicines only as told by your health care provider. Follow the directions carefully. Blood pressure medicines must be taken as prescribed. The medicine does not work as well when you skip doses. Skipping doses also puts you at risk for problems. Contact a health care provider if:  You think you are having a reaction to medicines you have taken.  You have repeated (recurrent) headaches.  You feel dizzy.  You have swelling in your ankles.  You have trouble with your vision. Get help right away if:  You develop a severe headache or confusion.  You have unusual weakness or numbness, or you feel faint.  You have severe pain in your chest or abdomen.  You vomit repeatedly.  You have trouble breathing. Summary  Hypertension  is when the force of blood pumping through your arteries is too strong. If this condition is not controlled, it may put you at risk for serious complications.  Your personal target blood pressure may vary depending on your medical conditions, your age, and other factors. For most people, a normal blood pressure is less than 120/80.  Hypertension is managed by lifestyle changes, medicines, or both. Lifestyle changes include weight loss, eating a healthy, low-sodium diet, exercising more, and limiting alcohol. This information is not intended to replace advice given to you by your health care provider. Make sure you discuss any questions you have with your health care provider. Document Released: 09/20/2011 Document Revised: 04/19/2018 Document Reviewed: 11/24/2015 Elsevier Patient Education  2020 [employer]

## 2018-07-13 NOTE — Progress Notes (Signed)
Subjective:     Patient ID: Jerome Pacheco , male    DOB: Jun 03, 1955 , 63 y.o.   MRN: [id number]   Chief Complaint  Patient presents with  . Hypertension    HPI  He is here today for a bp check.  His bp was elevated at last visit. He reports that he has improved medication compliance. He also reports exercising on a regular basis.   Hypertension     Past Medical History:  Diagnosis Date  . Hypertension   . Sleep apnea      Family History  Problem Relation Age of Onset  . Heart disease Father   . Cancer Mother      Current Outpatient Medications:  .  amLODipine (NORVASC) 10 MG tablet, Take 1 tablet by mouth once daily, Disp: 90 tablet, Rfl: 0 .  carvedilol (COREG) 12.5 MG tablet, Take 1 tablet (12.5 mg total) by mouth 2 (two) times daily., Disp: 180 tablet, Rfl: 1 .  ibuprofen (ADVIL,MOTRIN) 200 MG tablet, Take 800 mg by mouth every 8 (eight) hours as needed. For pain, Disp: , Rfl:  .  nabumetone (RELAFEN) 750 MG tablet, Take 750 mg by mouth 2 (two) times daily., Disp: , Rfl:  .  olmesartan (BENICAR) 40 MG tablet, Take 40 mg by mouth daily., Disp: , Rfl:  .  POTASSIUM CHLORIDE ER PO, Take by mouth., Disp: , Rfl:  .  spironolactone (ALDACTONE) 50 MG tablet, Take 1 tablet (50 mg total) by mouth daily., Disp: 90 tablet, Rfl: 1 .  hydrochlorothiazide (HYDRODIURIL) 25 MG tablet, Take 1 tablet (25 mg total) by mouth daily. (Patient not taking: Reported on 07/11/2018), Disp: 90 tablet, Rfl: 0 .  Olmesartan-amLODIPine-HCTZ (TRIBENZOR) 40-10-25 MG TABS, Take 1 tablet by mouth every morning., Disp: 90 tablet, Rfl: 1   No Known Allergies   Review of Systems  Constitutional: Negative.   Respiratory: Negative.   Cardiovascular: Negative.   Gastrointestinal: Negative.   Neurological: Negative.   Psychiatric/Behavioral: Negative.      Today's Vitals   07/11/18 1118  BP: (!) 160/90  Pulse: 88  Temp: 98.2 F (36.8 C)  TempSrc: Oral  SpO2: 97%  Weight: 207 lb 6.4 oz (94.1 kg)   Height: 5' 4.8" (1.646 m)   Body mass index is 34.73 kg/m.   Objective:  Physical Exam Vitals signs and nursing note reviewed.  Constitutional:      Appearance: Normal appearance.  Cardiovascular:     Rate and Rhythm: Normal rate and regular rhythm.     Heart sounds: Normal heart sounds.  Pulmonary:     Effort: Pulmonary effort is normal.     Breath sounds: Normal breath sounds.  Skin:    General: Skin is warm.  Neurological:     General: No focal deficit present.     Mental Status: He is alert.  Psychiatric:        Mood and Affect: Mood normal.         Assessment And Plan:     1. Essential hypertension, benign  Chronic. Repeat BP was 140/90. He does not wish to take an additional medication at this time. A prescription for Tribenzor was sent to [facility], this should improve compliance by decreasing the pill burden. We discussed the role of salt intake and blood pressure. He admits to judicious use of salt and agrees to cut back. He is encouraged to avoid adding salt to his foods and to avoid packaged foods which tend to be high in  sodium. He is reminded that bread and cheese have high levels of sodium. He will rto in six to eight weeks for re-evaluation. If needed, I plan to add another medication to his current regimen. He would likely benefit from Bidil. If he has a good response to this, he may be able to wean off of spironolactone. All questions were answered to his satisfaction.   Maximino Greenland, MD    THE PATIENT IS ENCOURAGED TO PRACTICE SOCIAL DISTANCING DUE TO THE COVID-19 PANDEMIC.

## 2018-08-27 ENCOUNTER — Encounter: Payer: Self-pay | Admitting: Internal Medicine

## 2018-08-27 ENCOUNTER — Other Ambulatory Visit: Payer: Self-pay

## 2018-08-27 ENCOUNTER — Ambulatory Visit (INDEPENDENT_AMBULATORY_CARE_PROVIDER_SITE_OTHER): Payer: BC Managed Care – PPO | Admitting: Internal Medicine

## 2018-08-27 VITALS — BP 136/84 | HR 57 | Temp 98.3°F | Ht 64.8 in | Wt 210.6 lb

## 2018-08-27 DIAGNOSIS — I10 Essential (primary) hypertension: Secondary | ICD-10-CM

## 2018-08-27 DIAGNOSIS — R7309 Other abnormal glucose: Secondary | ICD-10-CM | POA: Diagnosis not present

## 2018-08-27 DIAGNOSIS — Z6835 Body mass index (BMI) 35.0-35.9, adult: Secondary | ICD-10-CM | POA: Diagnosis not present

## 2018-08-27 MED ORDER — AMLODIPINE BESYLATE 10 MG PO TABS: 10.0000 mg | ORAL_TABLET | Freq: Every day | ORAL | 2 refills | Status: DC

## 2018-08-27 NOTE — Patient Instructions (Signed)
Heart-Healthy Eating Plan °Heart-healthy meal planning includes: °· Eating less unhealthy fats. °· Eating more healthy fats. °· Making other changes in your diet. °Talk with your doctor or a diet specialist (dietitian) to create an eating plan that is right for you. °What is my plan? °Your doctor may recommend an eating plan that includes: °· Total fat: ______% or less of total calories a day. °· Saturated fat: ______% or less of total calories a day. °· Cholesterol: less than _________mg a day. °What are tips for following this plan? °Cooking °Avoid frying your food. Try to bake, boil, grill, or broil it instead. You can also reduce fat by: °· Removing the skin from poultry. °· Removing all visible fats from meats. °· Steaming vegetables in water or broth. °Meal planning ° °· At meals, divide your plate into four equal parts: °? Fill one-half of your plate with vegetables and green salads. °? Fill one-fourth of your plate with whole grains. °? Fill one-fourth of your plate with lean protein foods. °· Eat 4-5 servings of vegetables per day. A serving of vegetables is: °? 1 cup of raw or cooked vegetables. °? 2 cups of raw leafy greens. °· Eat 4-5 servings of fruit per day. A serving of fruit is: °? 1 medium whole fruit. °? ¼ cup of dried fruit. °? ½ cup of fresh, frozen, or canned fruit. °? ½ cup of 100% fruit juice. °· Eat more foods that have soluble fiber. These are apples, broccoli, carrots, beans, peas, and barley. Try to get 20-30 g of fiber per day. °· Eat 4-5 servings of nuts, legumes, and seeds per week: °? 1 serving of dried beans or legumes equals ½ cup after being cooked. °? 1 serving of nuts is ¼ cup. °? 1 serving of seeds equals 1 tablespoon. °General information °· Eat more home-cooked food. Eat less restaurant, buffet, and fast food. °· Limit or avoid alcohol. °· Limit foods that are high in starch and sugar. °· Avoid fried foods. °· Lose weight if you are overweight. °· Keep track of how much salt  (sodium) you eat. This is important if you have high blood pressure. Ask your doctor to tell you more about this. °· Try to add vegetarian meals each week. °Fats °· Choose healthy fats. These include olive oil and canola oil, flaxseeds, walnuts, almonds, and seeds. °· Eat more omega-3 fats. These include salmon, mackerel, sardines, tuna, flaxseed oil, and ground flaxseeds. Try to eat fish at least 2 times each week. °· Check food labels. Avoid foods with trans fats or high amounts of saturated fat. °· Limit saturated fats. °? These are often found in animal products, such as meats, butter, and cream. °? These are also found in plant foods, such as palm oil, palm kernel oil, and coconut oil. °· Avoid foods with partially hydrogenated oils in them. These have trans fats. Examples are stick margarine, some tub margarines, cookies, crackers, and other baked goods. °What foods can I eat? °Fruits °All fresh, canned (in natural juice), or frozen fruits. °Vegetables °Fresh or frozen vegetables (raw, steamed, roasted, or grilled). Green salads. °Grains °Most grains. Choose whole wheat and whole grains most of the time. Rice and pasta, including brown rice and pastas made with whole wheat. °Meats and other proteins °Lean, well-trimmed beef, veal, pork, and lamb. Chicken and turkey without skin. All fish and shellfish. Wild duck, rabbit, pheasant, and venison. Egg whites or low-cholesterol egg substitutes. Dried beans, peas, lentils, and tofu. Seeds and most   nuts. °Dairy °Low-fat or nonfat cheeses, including ricotta and mozzarella. Skim or 1% milk that is liquid, powdered, or evaporated. Buttermilk that is made with low-fat milk. Nonfat or low-fat yogurt. °Fats and oils °Non-hydrogenated (trans-free) margarines. Vegetable oils, including soybean, sesame, sunflower, olive, peanut, safflower, corn, canola, and cottonseed. Salad dressings or mayonnaise made with a vegetable oil. °Beverages °Mineral water. Coffee and tea. Diet  carbonated beverages. °Sweets and desserts °Sherbet, gelatin, and fruit ice. Small amounts of dark chocolate. °Limit all sweets and desserts. °Seasonings and condiments °All seasonings and condiments. °The items listed above may not be a complete list of foods and drinks you can eat. Contact a dietitian for more options. °What foods should I avoid? °Fruits °Canned fruit in heavy syrup. Fruit in cream or butter sauce. Fried fruit. Limit coconut. °Vegetables °Vegetables cooked in cheese, cream, or butter sauce. Fried vegetables. °Grains °Breads that are made with saturated or trans fats, oils, or whole milk. Croissants. Sweet rolls. Donuts. High-fat crackers, such as cheese crackers. °Meats and other proteins °Fatty meats, such as hot dogs, ribs, sausage, bacon, rib-eye roast or steak. High-fat deli meats, such as salami and bologna. Caviar. Domestic duck and goose. Organ meats, such as liver. °Dairy °Cream, sour cream, cream cheese, and creamed cottage cheese. Whole-milk cheeses. Whole or 2% milk that is liquid, evaporated, or condensed. Whole buttermilk. Cream sauce or high-fat cheese sauce. Yogurt that is made from whole milk. °Fats and oils °Meat fat, or shortening. Cocoa butter, hydrogenated oils, palm oil, coconut oil, palm kernel oil. Solid fats and shortenings, including bacon fat, salt pork, lard, and butter. Nondairy cream substitutes. Salad dressings with cheese or sour cream. °Beverages °Regular sodas and juice drinks with added sugar. °Sweets and desserts °Frosting. Pudding. Cookies. Cakes. Pies. Milk chocolate or white chocolate. Buttered syrups. Full-fat ice cream or ice cream drinks. °The items listed above may not be a complete list of foods and drinks to avoid. Contact a dietitian for more information. °Summary °· Heart-healthy meal planning includes eating less unhealthy fats, eating more healthy fats, and making other changes in your diet. °· Eat a balanced diet. This includes fruits and  vegetables, low-fat or nonfat dairy, lean protein, nuts and legumes, whole grains, and heart-healthy oils and fats. °This information is not intended to replace advice given to you by your health care provider. Make sure you discuss any questions you have with your health care provider. °Document Released: 06/27/2011 Document Revised: 03/01/2017 Document Reviewed: 02/02/2017 °Elsevier Patient Education © 2020 Elsevier Inc. ° °

## 2018-08-28 LAB — CMP14+EGFR
ALT: 29 IU/L (ref 0–44)
AST: 22 IU/L (ref 0–40)
Albumin/Globulin Ratio: 2 (ref 1.2–2.2)
Albumin: 4.4 g/dL (ref 3.8–4.8)
Alkaline Phosphatase: 68 IU/L (ref 39–117)
BUN/Creatinine Ratio: 13 (ref 10–24)
BUN: 16 mg/dL (ref 8–27)
Bilirubin Total: 1.4 mg/dL — ABNORMAL HIGH (ref 0.0–1.2)
CO2: 27 mmol/L (ref 20–29)
Calcium: 9.5 mg/dL (ref 8.6–10.2)
Chloride: 103 mmol/L (ref 96–106)
Creatinine, Ser: 1.21 mg/dL (ref 0.76–1.27)
GFR calc Af Amer: 74 mL/min/{1.73_m2} (ref >59)
GFR calc non Af Amer: 64 mL/min/{1.73_m2} (ref >59)
Globulin, Total: 2.2 g/dL (ref 1.5–4.5)
Glucose: 106 mg/dL — ABNORMAL HIGH (ref 65–99)
Potassium: 3.8 mmol/L (ref 3.5–5.2)
Sodium: 141 mmol/L (ref 134–144)
Total Protein: 6.6 g/dL (ref 6.0–8.5)

## 2018-08-28 LAB — HEMOGLOBIN A1C
Est. average glucose Bld gHb Est-mCnc: 114 mg/dL
Hgb A1c MFr Bld: 5.6 % (ref 4.8–5.6)

## 2018-08-28 LAB — LIPID PANEL
Chol/HDL Ratio: 3.6 ratio (ref 0.0–5.0)
Cholesterol, Total: 164 mg/dL (ref 100–199)
HDL: 45 mg/dL (ref >39)
LDL Calculated: 93 mg/dL (ref 0–99)
Triglycerides: 129 mg/dL (ref 0–149)
VLDL Cholesterol Cal: 26 mg/dL (ref 5–40)

## 2018-09-01 NOTE — Progress Notes (Signed)
Subjective:     Patient ID: Jerome Pacheco , male    DOB: 01-10-1956 , 63 y.o.   MRN: [id number]   Chief Complaint  Patient presents with  . Hypertension    HPI  He is here today for bp check. He reports improved compliance with meds since Tribenzor prescribed as one pill at his last visit. He also reports regular exercise, he usually rides 10 miles on bike at least five days per week.     Past Medical History:  Diagnosis Date  . Hypertension   . Sleep apnea      Family History  Problem Relation Age of Onset  . Heart disease Father   . Cancer Mother      Current Outpatient Medications:  .  Acetaminophen (TYLENOL 8 HOUR PO), Take by mouth. 200 mg per the pt, Disp: , Rfl:  .  amLODipine (NORVASC) 10 MG tablet, Take 1 tablet (10 mg total) by mouth daily., Disp: 60 tablet, Rfl: 2 .  carvedilol (COREG) 12.5 MG tablet, Take 1 tablet (12.5 mg total) by mouth 2 (two) times daily., Disp: 180 tablet, Rfl: 1 .  hydrochlorothiazide (HYDRODIURIL) 25 MG tablet, Take 1 tablet (25 mg total) by mouth daily., Disp: 90 tablet, Rfl: 0 .  ibuprofen (ADVIL,MOTRIN) 200 MG tablet, Take 800 mg by mouth every 8 (eight) hours as needed. For pain, Disp: , Rfl:  .  olmesartan (BENICAR) 40 MG tablet, Take 40 mg by mouth daily., Disp: , Rfl:  .  POTASSIUM CHLORIDE ER PO, Take by mouth., Disp: , Rfl:  .  spironolactone (ALDACTONE) 50 MG tablet, Take 1 tablet (50 mg total) by mouth daily., Disp: 90 tablet, Rfl: 1 .  nabumetone (RELAFEN) 750 MG tablet, Take 750 mg by mouth 2 (two) times daily., Disp: , Rfl:  .  Olmesartan-amLODIPine-HCTZ (TRIBENZOR) 40-10-25 MG TABS, Take 1 tablet by mouth every morning. (Patient not taking: Reported on 08/27/2018), Disp: 90 tablet, Rfl: 1   No Known Allergies   Review of Systems  Constitutional: Negative.   Respiratory: Negative.   Cardiovascular: Negative.   Gastrointestinal: Negative.   Neurological: Negative.   Psychiatric/Behavioral: Negative.      Today's Vitals    08/27/18 1011  BP: 136/84  Pulse: (!) 57  Temp: 98.3 F (36.8 C)  TempSrc: Oral  Weight: 210 lb 9.6 oz (95.5 kg)  Height: 5' 4.8" (1.646 m)   Body mass index is 35.26 kg/m.   Objective:  Physical Exam Vitals signs and nursing note reviewed.  Constitutional:      Appearance: Normal appearance.  Cardiovascular:     Rate and Rhythm: Normal rate and regular rhythm.     Heart sounds: Normal heart sounds.  Pulmonary:     Effort: Pulmonary effort is normal.     Breath sounds: Normal breath sounds.  Skin:    General: Skin is warm.  Neurological:     General: No focal deficit present.     Mental Status: He is alert.  Psychiatric:        Mood and Affect: Mood normal.         Assessment And Plan:     1. Essential hypertension, benign  Chronic, improved control.  He will continue with current meds. He was congratulated on his lifestyle changes and encouraged to keep up the great work. He will rto in 6 months for re-evaluation.   - Lipid panel - CMP14+EGFR  2. Other abnormal glucose  HIS A1C HAS BEEN ELEVATED IN THE PAST.  I WILL CHECK AN A1C, BMET TODAY. HE WAS ENCOURAGED TO AVOID SUGARY BEVERAGES AND PROCESSED FOODS INCLUDNG BREADS, RICE AND PASTA.  - Hemoglobin A1c  3. Class 2 severe obesity due to excess calories with serious comorbidity and body mass index (BMI) of 35.0 to 35.9 in adult [facility])  He is encouraged to strive for BMI less than 30 to decrease cardiac risk.   Maximino Greenland, MD    THE PATIENT IS ENCOURAGED TO PRACTICE SOCIAL DISTANCING DUE TO THE COVID-19 PANDEMIC.

## 2018-09-25 ENCOUNTER — Encounter: Payer: Self-pay | Admitting: Internal Medicine

## 2018-09-25 ENCOUNTER — Other Ambulatory Visit: Payer: Self-pay

## 2018-09-25 ENCOUNTER — Ambulatory Visit (INDEPENDENT_AMBULATORY_CARE_PROVIDER_SITE_OTHER): Payer: BC Managed Care – PPO

## 2018-09-25 VITALS — BP 186/102 | HR 82 | Temp 98.5°F | Ht 64.4 in | Wt 208.8 lb

## 2018-09-25 DIAGNOSIS — Z23 Encounter for immunization: Secondary | ICD-10-CM

## 2018-09-25 NOTE — Progress Notes (Signed)
Pt presented today for a flu shot and b/p check b/p 186/102 Dr.Sanders aware

## 2018-10-08 ENCOUNTER — Other Ambulatory Visit: Payer: Self-pay

## 2018-10-08 ENCOUNTER — Ambulatory Visit: Payer: BC Managed Care – PPO

## 2018-10-08 VITALS — BP 138/92 | Temp 98.3°F | Ht 64.6 in | Wt 209.0 lb

## 2018-10-08 DIAGNOSIS — I10 Essential (primary) hypertension: Secondary | ICD-10-CM

## 2018-10-08 NOTE — Progress Notes (Signed)
Pt presents today for b/p check 138/92 Dr. Baird Cancer aware

## 2018-11-12 ENCOUNTER — Telehealth: Payer: Self-pay

## 2018-11-12 ENCOUNTER — Encounter: Payer: Self-pay | Admitting: Internal Medicine

## 2018-11-12 NOTE — Telephone Encounter (Signed)
Looked at dispense history of medication and was filled at [facility] 08/27/18 before taking amlodipine he was taking [id number]-aml-hctz and it was filled 07/11/18

## 2018-11-12 NOTE — Telephone Encounter (Signed)
I left the pt a message to call the office back.  I was calling the pt to ask has he been taking his amlodipine 10 mg medication daily because [facility] sent a fax that the pt may not be compliant based off of his refill history.  I also sent the pt a mychart message.

## 2019-02-10 ENCOUNTER — Other Ambulatory Visit: Payer: Self-pay | Admitting: Internal Medicine

## 2019-02-20 ENCOUNTER — Ambulatory Visit: Payer: BC Managed Care – PPO | Attending: Family

## 2019-02-20 DIAGNOSIS — Z23 Encounter for immunization: Secondary | ICD-10-CM | POA: Insufficient documentation

## 2019-02-20 NOTE — Progress Notes (Signed)
   Covid-19 Vaccination Clinic  Name:  Jerome Pacheco    MRN: [id number] DOB: 12-28-1955  02/20/2019  Mr. Jerome Pacheco was observed post Covid-19 immunization for 15 minutes without incidence. He was provided with Vaccine Information Sheet and instruction to access the V-Safe system.   Mr. Jerome Pacheco was instructed to call 911 with any severe reactions post vaccine: Marland Kitchen Difficulty breathing  . Swelling of your face and throat  . A fast heartbeat  . A bad rash all over your body  . Dizziness and weakness    Immunizations Administered    Name Date Dose VIS Date Route   Moderna COVID-19 Vaccine 02/20/2019 11:03 AM 0.5 mL 12/10/2018 Intramuscular   Manufacturer: Moderna   Lot: [id number]   NDC: 44967-591-63

## 2019-03-03 ENCOUNTER — Ambulatory Visit (INDEPENDENT_AMBULATORY_CARE_PROVIDER_SITE_OTHER): Payer: BC Managed Care – PPO | Admitting: Internal Medicine

## 2019-03-03 ENCOUNTER — Other Ambulatory Visit: Payer: Self-pay

## 2019-03-03 ENCOUNTER — Encounter: Payer: Self-pay | Admitting: Internal Medicine

## 2019-03-03 VITALS — BP 114/80 | HR 83 | Temp 98.1°F | Ht 65.2 in | Wt 217.8 lb

## 2019-03-03 DIAGNOSIS — Z6836 Body mass index (BMI) 36.0-36.9, adult: Secondary | ICD-10-CM

## 2019-03-03 DIAGNOSIS — Z0000 Encounter for general adult medical examination without abnormal findings: Secondary | ICD-10-CM

## 2019-03-03 DIAGNOSIS — Z1211 Encounter for screening for malignant neoplasm of colon: Secondary | ICD-10-CM

## 2019-03-03 DIAGNOSIS — I10 Essential (primary) hypertension: Secondary | ICD-10-CM | POA: Diagnosis not present

## 2019-03-03 LAB — POCT URINALYSIS DIPSTICK
Bilirubin, UA: NEGATIVE
Blood, UA: NEGATIVE
Glucose, UA: NEGATIVE
Ketones, UA: NEGATIVE
Leukocytes, UA: NEGATIVE
Nitrite, UA: NEGATIVE
Protein, UA: NEGATIVE
Spec Grav, UA: 1.025 (ref 1.010–1.025)
Urobilinogen, UA: 0.2 E.U./dL
pH, UA: 5.5 (ref 5.0–8.0)

## 2019-03-03 LAB — POCT UA - MICROALBUMIN
Albumin/Creatinine Ratio, Urine, POC: <30
Creatinine, POC: 200 mg/dL
Microalbumin Ur, POC: 10 mg/L

## 2019-03-03 NOTE — Patient Instructions (Signed)

## 2019-03-03 NOTE — Progress Notes (Signed)
This visit occurred during the SARS-CoV-2 public health emergency.  Safety protocols were in place, including screening questions prior to the visit, additional usage of staff PPE, and extensive cleaning of exam room while observing appropriate contact time as indicated for disinfecting solutions.  Subjective:     Patient ID: Jerome Pacheco , male    DOB: 1955-05-02 , 64 y.o.   MRN: [id number]   Chief Complaint  Patient presents with  . Annual Exam  . Hypertension    HPI  He is here today for a full physical examination.  He has no specific concerns at this time.  He reports compliance with meds. He has also cut back on his caffeine intake. He thinks this has helped with his BP readings.   Hypertension This is a chronic problem. The current episode started more than 1 year ago. The problem has been gradually improving since onset. The problem is controlled. Pertinent negatives include no blurred vision, chest pain, palpitations or shortness of breath. Risk factors for coronary artery disease include male gender and obesity.     Past Medical History:  Diagnosis Date  . Hypertension   . Sleep apnea      Family History  Problem Relation Age of Onset  . Heart disease Father   . Cancer Mother      Current Outpatient Medications:  .  carvedilol (COREG) 12.5 MG tablet, Take 1 tablet (12.5 mg total) by mouth 2 (two) times daily., Disp: 180 tablet, Rfl: 1 .  ibuprofen (ADVIL,MOTRIN) 200 MG tablet, Take 800 mg by mouth every 8 (eight) hours as needed. For pain, Disp: , Rfl:  .  nabumetone (RELAFEN) 750 MG tablet, Take 750 mg by mouth 2 (two) times daily., Disp: , Rfl:  .  Olmesartan-amLODIPine-HCTZ (TRIBENZOR) 40-10-25 MG TABS, Take 1 tablet by mouth every morning., Disp: 90 tablet, Rfl: 1 .  POTASSIUM CHLORIDE ER PO, Take by mouth., Disp: , Rfl:  .  spironolactone (ALDACTONE) 50 MG tablet, Take 1 tablet by mouth once daily, Disp: 90 tablet, Rfl: 0   No Known Allergies   Men's  preventive visit. Patient Health Questionnaire (PHQ-2) is    Office Visit from 07/11/2018 in [facility]  PHQ-2 Total Score  0    . Patient is on a healthy diet. Marital status: Married. Relevant history for alcohol use is:  Social History   Substance and Sexual Activity  Alcohol Use No   Comment: quit 2012  . Relevant history for tobacco use is:  Social History   Tobacco Use  Smoking Status Never Smoker  Smokeless Tobacco Never Used   Review of Systems  Constitutional: Negative.   HENT: Negative.   Eyes: Negative.  Negative for blurred vision.  Respiratory: Negative.  Negative for shortness of breath.   Cardiovascular: Negative.  Negative for chest pain and palpitations.  Gastrointestinal: Negative.   Endocrine: Negative.   Genitourinary: Negative.   Musculoskeletal: Negative.   Skin: Negative.   Allergic/Immunologic: Negative.   Neurological: Negative.   Hematological: Negative.   Psychiatric/Behavioral: Negative.      Today's Vitals   03/03/19 0852  BP: 114/80  Pulse: 83  Temp: 98.1 F (36.7 C)  TempSrc: Oral  Weight: 217 lb 12.8 oz (98.8 kg)  Height: 5' 5.2" (1.656 m)   Body mass index is 36.02 kg/m.   Objective:  Physical Exam Vitals and nursing note reviewed.  Constitutional:      Appearance: Normal appearance. He is obese.  HENT:  Head: Normocephalic and atraumatic.     Right Ear: Tympanic membrane, ear canal and external ear normal.     Left Ear: Tympanic membrane, ear canal and external ear normal.     Nose:     Comments: Deferred, masked    Mouth/Throat:     Comments: Deferred, masked Eyes:     Extraocular Movements: Extraocular movements intact.     Conjunctiva/sclera: Conjunctivae normal.     Pupils: Pupils are equal, round, and reactive to light.  Cardiovascular:     Rate and Rhythm: Normal rate and regular rhythm.     Pulses: Normal pulses.     Heart sounds: Normal heart sounds.  Pulmonary:     Effort:  Pulmonary effort is normal.     Breath sounds: Normal breath sounds.  Chest:     Breasts:        Right: Normal. No swelling, bleeding, inverted nipple, mass or nipple discharge.        Left: Normal. No swelling, bleeding, inverted nipple, mass or nipple discharge.  Abdominal:     General: Bowel sounds are normal.     Palpations: Abdomen is soft.     Comments: Rounded, soft  Genitourinary:    Prostate: Normal.     Rectum: Normal. Guaiac result negative.     Comments: Prostate is firm, no nodules appreciated Musculoskeletal:        General: Normal range of motion.     Cervical back: Normal range of motion and neck supple.  Skin:    General: Skin is warm.  Neurological:     General: No focal deficit present.     Mental Status: He is alert.  Psychiatric:        Mood and Affect: Mood normal.        Behavior: Behavior normal.         Assessment And Plan:     1. Routine general medical examination at health care facility  A full exam was performed. DRE performed, stool heme negative.  PATIENT IS ADVISED TO GET 30-45 MINUTES REGULAR EXERCISE NO LESS THAN FOUR TO FIVE DAYS PER WEEK - BOTH WEIGHTBEARING EXERCISES AND AEROBIC ARE RECOMMENDED.  HE IS ADVISED TO FOLLOW A HEALTHY DIET WITH AT LEAST SIX FRUITS/VEGGIES PER DAY, DECREASE INTAKE OF RED MEAT, AND TO INCREASE FISH INTAKE TO TWO DAYS PER WEEK.  MEATS/FISH SHOULD NOT BE FRIED, BAKED OR BROILED IS PREFERABLE.  I SUGGEST WEARING SPF 50 SUNSCREEN ON EXPOSED PARTS AND ESPECIALLY WHEN IN THE DIRECT SUNLIGHT FOR AN EXTENDED PERIOD OF TIME.  PLEASE AVOID FAST FOOD RESTAURANTS AND INCREASE YOUR WATER INTAKE.  - POCT occult blood stool - CMP14+EGFR - CBC - Lipid panel - PSA  2. Essential hypertension, benign  Chronic, well controlled. He will continue with current meds. He is encouraged to avoid adding salt to his foods. EKG performed, NSR w/o acute changes. He will rto in six months for re-evaluation.   - EKG 12-Lead - POCT  Urinalysis Dipstick (81002) - POCT UA - Microalbumin  3. Class 2 severe obesity due to excess calories with serious comorbidity and body mass index (BMI) of 36.0 to 36.9 in adult [facility])  He is encouraged to lose ten percent of his body weight to decrease cardiac risk. He is advised to aim for at least 150 minutes of exercise per week.       Maximino Greenland, MD    THE PATIENT IS ENCOURAGED TO PRACTICE SOCIAL DISTANCING DUE TO THE COVID-19 PANDEMIC.

## 2019-03-04 LAB — CBC
Hematocrit: 41.9 % (ref 37.5–51.0)
Hemoglobin: 14.6 g/dL (ref 13.0–17.7)
MCH: 30.6 pg (ref 26.6–33.0)
MCHC: 34.8 g/dL (ref 31.5–35.7)
MCV: 88 fL (ref 79–97)
Platelets: 259 10*3/uL (ref 150–450)
RBC: 4.77 x10E6/uL (ref 4.14–5.80)
RDW: 13.1 % (ref 11.6–15.4)
WBC: 6.2 10*3/uL (ref 3.4–10.8)

## 2019-03-04 LAB — CMP14+EGFR
ALT: 36 IU/L (ref 0–44)
AST: 25 IU/L (ref 0–40)
Albumin/Globulin Ratio: 2.1 (ref 1.2–2.2)
Albumin: 4.9 g/dL — ABNORMAL HIGH (ref 3.8–4.8)
Alkaline Phosphatase: 76 IU/L (ref 39–117)
BUN/Creatinine Ratio: 13 (ref 10–24)
BUN: 16 mg/dL (ref 8–27)
Bilirubin Total: 1.2 mg/dL (ref 0.0–1.2)
CO2: 26 mmol/L (ref 20–29)
Calcium: 10.2 mg/dL (ref 8.6–10.2)
Chloride: 103 mmol/L (ref 96–106)
Creatinine, Ser: 1.2 mg/dL (ref 0.76–1.27)
GFR calc Af Amer: 74 mL/min/{1.73_m2} (ref >59)
GFR calc non Af Amer: 64 mL/min/{1.73_m2} (ref >59)
Globulin, Total: 2.3 g/dL (ref 1.5–4.5)
Glucose: 118 mg/dL — ABNORMAL HIGH (ref 65–99)
Potassium: 3.8 mmol/L (ref 3.5–5.2)
Sodium: 144 mmol/L (ref 134–144)
Total Protein: 7.2 g/dL (ref 6.0–8.5)

## 2019-03-04 LAB — HEMOCCULT GUIAC POC 1CARD (OFFICE)
Card #1 Date: 2222021
Fecal Occult Blood, POC: NEGATIVE

## 2019-03-04 LAB — LIPID PANEL
Chol/HDL Ratio: 4 ratio (ref 0.0–5.0)
Cholesterol, Total: 174 mg/dL (ref 100–199)
HDL: 44 mg/dL (ref >39)
LDL Chol Calc (NIH): 101 mg/dL — ABNORMAL HIGH (ref 0–99)
Triglycerides: 169 mg/dL — ABNORMAL HIGH (ref 0–149)
VLDL Cholesterol Cal: 29 mg/dL (ref 5–40)

## 2019-03-04 LAB — PSA: Prostate Specific Ag, Serum: 0.8 ng/mL (ref 0.0–4.0)

## 2019-03-25 ENCOUNTER — Ambulatory Visit: Payer: BC Managed Care – PPO | Attending: Family

## 2019-03-25 DIAGNOSIS — Z23 Encounter for immunization: Secondary | ICD-10-CM

## 2019-03-25 NOTE — Progress Notes (Signed)
   Covid-19 Vaccination Clinic  Name:  Jerome Pacheco    MRN: [id number] DOB: 1955/11/02  03/25/2019  [name] was observed post Covid-19 immunization for 15 minutes without incident. He was provided with Vaccine Information Sheet and instruction to access the V-Safe system.   [name] was instructed to call 911 with any severe reactions post vaccine: Marland Kitchen Difficulty breathing  . Swelling of face and throat  . A fast heartbeat  . A bad rash all over body  . Dizziness and weakness   Immunizations Administered    Name Date Dose VIS Date Route   Moderna COVID-19 Vaccine 03/25/2019 10:14 AM 0.5 mL 12/10/2018 Intramuscular   Manufacturer: Moderna   Lot: [id number]   NDC: 91694-503-88

## 2019-03-26 ENCOUNTER — Other Ambulatory Visit: Payer: Self-pay | Admitting: Internal Medicine

## 2019-05-20 ENCOUNTER — Other Ambulatory Visit: Payer: Self-pay | Admitting: Internal Medicine

## 2019-07-07 ENCOUNTER — Other Ambulatory Visit: Payer: Self-pay | Admitting: Internal Medicine

## 2019-07-24 ENCOUNTER — Other Ambulatory Visit: Payer: Self-pay

## 2019-07-24 ENCOUNTER — Encounter: Payer: Self-pay | Admitting: Internal Medicine

## 2019-07-24 MED ORDER — COLCHICINE 0.6 MG PO TABS: 0.6000 mg | ORAL_TABLET | Freq: Every day | ORAL | 1 refills | Status: DC

## 2019-09-01 ENCOUNTER — Encounter: Payer: Self-pay | Admitting: Internal Medicine

## 2019-09-01 ENCOUNTER — Other Ambulatory Visit: Payer: Self-pay

## 2019-09-01 ENCOUNTER — Ambulatory Visit (INDEPENDENT_AMBULATORY_CARE_PROVIDER_SITE_OTHER): Payer: BC Managed Care – PPO | Admitting: Internal Medicine

## 2019-09-01 VITALS — BP 118/76 | HR 74 | Temp 97.8°F | Ht 65.2 in | Wt 214.0 lb

## 2019-09-01 DIAGNOSIS — Z6835 Body mass index (BMI) 35.0-35.9, adult: Secondary | ICD-10-CM | POA: Diagnosis not present

## 2019-09-01 DIAGNOSIS — R7309 Other abnormal glucose: Secondary | ICD-10-CM | POA: Diagnosis not present

## 2019-09-01 DIAGNOSIS — I10 Essential (primary) hypertension: Secondary | ICD-10-CM

## 2019-09-01 NOTE — Progress Notes (Signed)
This visit occurred during the SARS-CoV-2 public health emergency.  Safety protocols were in place, including screening questions prior to the visit, additional usage of staff PPE, and extensive cleaning of exam room while observing appropriate contact time as indicated for disinfecting solutions.  Subjective:     Patient ID: Jerome Pacheco , male    DOB: 13-Nov-1955 , 64 y.o.   MRN: [id number]   Chief Complaint  Patient presents with  . Hypertension    HPI  He is here today for bp check.  He reports compliance with meds.  Hypertension This is a chronic problem. The current episode started more than 1 year ago. The problem has been gradually improving since onset. The problem is controlled. Risk factors for coronary artery disease include obesity and stress. There are no compliance problems.      Past Medical History:  Diagnosis Date  . Hypertension   . Sleep apnea      Family History  Problem Relation Age of Onset  . Heart disease Father   . Cancer Mother      Current Outpatient Medications:  .  carvedilol (COREG) 12.5 MG tablet, Take 1 tablet (12.5 mg total) by mouth 2 (two) times daily. (Patient taking differently: Take 12.5 mg by mouth daily. ), Disp: 180 tablet, Rfl: 1 .  colchicine 0.6 MG tablet, Take 1 tablet (0.6 mg total) by mouth daily. (Patient taking differently: Take 0.6 mg by mouth daily. prn), Disp: 30 tablet, Rfl: 1 .  ibuprofen (ADVIL,MOTRIN) 200 MG tablet, Take 800 mg by mouth every 8 (eight) hours as needed. For pain, Disp: , Rfl:  .  Olmesartan-amLODIPine-HCTZ 40-10-25 MG TABS, TAKE (1) TABLET BY MOUTH EACH MORNING., Disp: 90 tablet, Rfl: 0 .  spironolactone (ALDACTONE) 50 MG tablet, Take 1 tablet by mouth once daily, Disp: 90 tablet, Rfl: 1   No Known Allergies   Review of Systems  Constitutional: Negative.   Respiratory: Negative.   Cardiovascular: Negative.   Gastrointestinal: Negative.   Psychiatric/Behavioral: Negative.   All other systems  reviewed and are negative.    Today's Vitals   09/01/19 0835  BP: 118/76  Pulse: 74  Temp: 97.8 F (36.6 C)  TempSrc: Oral  Weight: 214 lb (97.1 kg)  Height: 5' 5.2" (1.656 m)   Body mass index is 35.39 kg/m.   Objective:  Physical Exam Vitals and nursing note reviewed.  Constitutional:      Appearance: Normal appearance. He is obese.  HENT:     Head: Normocephalic and atraumatic.  Cardiovascular:     Rate and Rhythm: Normal rate and regular rhythm.     Heart sounds: Normal heart sounds.  Pulmonary:     Breath sounds: Normal breath sounds.  Skin:    General: Skin is warm.  Neurological:     General: No focal deficit present.     Mental Status: He is alert and oriented to person, place, and time.         Assessment And Plan:     1. Essential hypertension, malignant Comments: Chronic, well controlled. He has been taking carvedilol qhs only, he will continue with this regimen for now.  Encouraged to check BP regularly at home. He will rto in six months for re-evaluation. I will check renal function today.   - BMP8+EGFR  2. Other abnormal glucose  HER A1C HAS BEEN ELEVATED IN THE PAST. I WILL CHECK AN A1C, BMET TODAY. SHE WAS ENCOURAGED TO AVOID SUGARY BEVERAGES AND PROCESSED FOODS INCLUDNG BREADS, RICE  AND PASTA.  - Hemoglobin A1c  3. Class 2 severe obesity due to excess calories with serious comorbidity and body mass index (BMI) of 35.0 to 35.9 in adult [facility])  He is encouraged to initially strive for BMI less than 30 to decrease cardiac risk. He is advised to exercise no less than 150 minutes per week.    Patient was given opportunity to ask questions. Patient verbalized understanding of the plan and was able to repeat key elements of the plan. All questions were answered to their satisfaction.  Maximino Greenland, MD   I, Maximino Greenland, MD, have reviewed all documentation for this visit. The documentation on 09/01/19 for the exam, diagnosis, procedures, and  orders are all accurate and complete.  THE PATIENT IS ENCOURAGED TO PRACTICE SOCIAL DISTANCING DUE TO THE COVID-19 PANDEMIC.   I,Katawbba Wiggins,acting as a Education administrator for Maximino Greenland, MD.,have documented all relevant documentation on the behalf of Maximino Greenland, MD,as directed by  Maximino Greenland, MD while in the presence of Maximino Greenland, MD.

## 2019-09-01 NOTE — Patient Instructions (Signed)

## 2019-09-02 LAB — BMP8+EGFR
BUN/Creatinine Ratio: 20 (ref 10–24)
BUN: 23 mg/dL (ref 8–27)
CO2: 23 mmol/L (ref 20–29)
Calcium: 9.6 mg/dL (ref 8.6–10.2)
Chloride: 103 mmol/L (ref 96–106)
Creatinine, Ser: 1.13 mg/dL (ref 0.76–1.27)
GFR calc Af Amer: 80 mL/min/{1.73_m2} (ref >59)
GFR calc non Af Amer: 69 mL/min/{1.73_m2} (ref >59)
Glucose: 110 mg/dL — ABNORMAL HIGH (ref 65–99)
Potassium: 3.7 mmol/L (ref 3.5–5.2)
Sodium: 141 mmol/L (ref 134–144)

## 2019-09-02 LAB — HEMOGLOBIN A1C
Est. average glucose Bld gHb Est-mCnc: 128 mg/dL
Hgb A1c MFr Bld: 6.1 % — ABNORMAL HIGH (ref 4.8–5.6)

## 2019-09-07 ENCOUNTER — Other Ambulatory Visit: Payer: Self-pay | Admitting: Internal Medicine

## 2019-09-09 ENCOUNTER — Ambulatory Visit: Payer: BC Managed Care – PPO | Attending: Family

## 2019-09-09 DIAGNOSIS — Z23 Encounter for immunization: Secondary | ICD-10-CM

## 2019-09-17 NOTE — Progress Notes (Signed)
° °  Covid-19 Vaccination Clinic  Name:  Jerome Pacheco    MRN: [id number] DOB: [birth year]  09/17/2019  [name] was observed post Covid-19 immunization for 15 minutes without incident. He was provided with Vaccine Information Sheet and instruction to access the V-Safe system.   [name] was instructed to call 911 with any severe reactions post vaccine:  Difficulty breathing   Swelling of face and throat   A fast heartbeat   A bad rash all over body   Dizziness and weakness

## 2019-09-26 ENCOUNTER — Encounter: Payer: Self-pay | Admitting: Internal Medicine

## 2019-10-10 ENCOUNTER — Other Ambulatory Visit: Payer: Self-pay | Admitting: Internal Medicine

## 2019-11-06 ENCOUNTER — Ambulatory Visit (INDEPENDENT_AMBULATORY_CARE_PROVIDER_SITE_OTHER): Payer: BC Managed Care – PPO

## 2019-11-06 ENCOUNTER — Other Ambulatory Visit: Payer: Self-pay

## 2019-11-06 VITALS — BP 120/68 | HR 70 | Temp 98.1°F | Ht 65.2 in | Wt 213.8 lb

## 2019-11-06 DIAGNOSIS — Z23 Encounter for immunization: Secondary | ICD-10-CM | POA: Diagnosis not present

## 2020-03-08 ENCOUNTER — Encounter: Payer: BC Managed Care – PPO | Admitting: Internal Medicine

## 2020-03-25 ENCOUNTER — Other Ambulatory Visit: Payer: Self-pay | Admitting: Internal Medicine

## 2020-04-01 ENCOUNTER — Other Ambulatory Visit: Payer: Self-pay

## 2020-04-01 ENCOUNTER — Other Ambulatory Visit: Payer: Self-pay | Admitting: Nurse Practitioner

## 2020-04-01 ENCOUNTER — Ambulatory Visit (INDEPENDENT_AMBULATORY_CARE_PROVIDER_SITE_OTHER): Payer: BC Managed Care – PPO | Admitting: Nurse Practitioner

## 2020-04-01 VITALS — BP 132/78 | Temp 98.1°F | Ht 65.4 in | Wt 219.4 lb

## 2020-04-01 DIAGNOSIS — B360 Pityriasis versicolor: Secondary | ICD-10-CM | POA: Diagnosis not present

## 2020-04-01 DIAGNOSIS — Z6836 Body mass index (BMI) 36.0-36.9, adult: Secondary | ICD-10-CM

## 2020-04-01 DIAGNOSIS — H6123 Impacted cerumen, bilateral: Secondary | ICD-10-CM | POA: Diagnosis not present

## 2020-04-01 DIAGNOSIS — I10 Essential (primary) hypertension: Secondary | ICD-10-CM

## 2020-04-01 DIAGNOSIS — Z0000 Encounter for general adult medical examination without abnormal findings: Secondary | ICD-10-CM

## 2020-04-01 LAB — POCT URINALYSIS DIPSTICK
Bilirubin, UA: NEGATIVE
Blood, UA: NEGATIVE
Glucose, UA: NEGATIVE
Ketones, UA: NEGATIVE
Leukocytes, UA: NEGATIVE
Nitrite, UA: NEGATIVE
Protein, UA: NEGATIVE
Spec Grav, UA: 1.025 (ref 1.010–1.025)
Urobilinogen, UA: 0.2 E.U./dL
pH, UA: 5.5 (ref 5.0–8.0)

## 2020-04-01 LAB — POCT UA - MICROALBUMIN
Albumin/Creatinine Ratio, Urine, POC: <30
Creatinine, POC: 300 mg/dL
Microalbumin Ur, POC: 10 mg/L

## 2020-04-01 MED ORDER — SELENIUM SULFIDE 2.25 % EX SHAM: MEDICATED_SHAMPOO | CUTANEOUS | 0 refills | Status: DC

## 2020-04-01 MED ORDER — CARVEDILOL 12.5 MG PO TABS: 12.5000 mg | ORAL_TABLET | Freq: Two times a day (BID) | ORAL | 1 refills | Status: DC

## 2020-04-01 MED ORDER — KETOCONAZOLE 2 % EX CREA: TOPICAL_CREAM | CUTANEOUS | 1 refills | Status: DC

## 2020-04-01 NOTE — Patient Instructions (Addendum)

## 2020-04-01 NOTE — Progress Notes (Addendum)
I,Jerome Pacheco,acting as a Education administrator for [employer], NP.,have documented all relevant documentation on the behalf of [employer], NP,as directed by  Jerome Castilla, NP while in the presence of Jerome Castilla, NP.  This visit occurred during the SARS-CoV-2 public health emergency.  Safety protocols were in place, including screening questions prior to the visit, additional usage of staff PPE, and extensive cleaning of exam room while observing appropriate contact time as indicated for disinfecting solutions.  Subjective:     Patient ID: Jerome Pacheco , male    DOB: 1955/04/02 , 65 y.o.   MRN: [id number]   Chief Complaint  Patient presents with  . Annual Exam    HPI  He is here today for a full physical examination.  He has no specific concerns at this time except that he still has some rash or discoloration all over his body mainly upper body.  He reports compliance with meds. Diet: he has been working on reducing sugary drinks and drinking more water. Diet is normal. Some fruits and vegetables. No sodium on anything. He does pepper.  Exercise: He has not gotten exercise in the last 1.5 weeks. He injured his leg. His plan is to get back on treadmill next week. He does ride his bikes.  He does take vitamin B12 and other vitamins on a daily basis.   Hypertension This is a chronic problem. The current episode started more than 1 year ago. The problem has been gradually improving since onset. The problem is controlled. Pertinent negatives include no blurred vision, chest pain, palpitations or shortness of breath. Anxiety: ekg. Risk factors for coronary artery disease include male gender and obesity.     Past Medical History:  Diagnosis Date  . Hypertension   . Sleep apnea      Family History  Problem Relation Age of Onset  . Heart disease Father   . Cancer Mother     Current Outpatient Medications:  .  colchicine 0.6 MG tablet, Take 1 tablet (0.6 mg total) by mouth  daily. (Patient taking differently: Take 0.6 mg by mouth daily. prn), Disp: 30 tablet, Rfl: 1 .  ibuprofen (ADVIL,MOTRIN) 200 MG tablet, Take 800 mg by mouth every 8 (eight) hours as needed. For pain, Disp: , Rfl:  .  Olmesartan-amLODIPine-HCTZ 40-10-25 MG TABS, TAKE (1) TABLET BY MOUTH EACH MORNING., Disp: 90 tablet, Rfl: 0 .  Selenium Sulfide 2.25 % SHAM, Wet the skin, apply selenium sulfide to the affected area(s) and massage gently into a full lather. Leave it for 10 minutes, then rinse off thoroughly. Do this once daily for 7 days., Disp: 180 mL, Rfl: 0 .  spironolactone (ALDACTONE) 50 MG tablet, Take 1 tablet by mouth once daily, Disp: 90 tablet, Rfl: 2 .  carvedilol (COREG) 12.5 MG tablet, Take 1 tablet (12.5 mg total) by mouth 2 (two) times daily with a meal., Disp: 180 tablet, Rfl: 1   No Known Allergies   Men's preventive visit. Patient Health Questionnaire (PHQ-2) is  [place] Office Visit from 04/01/2020 in [facility]  PHQ-2 Total Score 0    . Patient is on a heart healthy diet. Marital status: Married. Relevant history for alcohol use is:  Social History   Substance and Sexual Activity  Alcohol Use No   Comment: quit 2012  . Relevant history for tobacco use is:  Social History   Tobacco Use  Smoking Status Never Smoker  Smokeless Tobacco Never Used  .   Review of Systems  Constitutional: Negative.  Negative for chills, fatigue and fever.  HENT: Negative.  Negative for congestion and hearing loss.   Eyes: Negative.  Negative for blurred vision, pain and visual disturbance.  Respiratory: Negative.  Negative for cough and shortness of breath.   Cardiovascular: Negative.  Negative for chest pain and palpitations.  Gastrointestinal: Negative.  Negative for constipation, diarrhea and nausea.  Endocrine: Negative.  Negative for polydipsia, polyphagia and polyuria.  Genitourinary: Negative.   Musculoskeletal: Negative.  Negative for back pain and  myalgias.  Skin: Negative.   Allergic/Immunologic: Negative.   Neurological: Negative.  Negative for dizziness, seizures, weakness and numbness.  Hematological: Negative.   Psychiatric/Behavioral: Negative for sleep disturbance.     Today's Vitals   04/01/20 0836  BP: 132/78  Temp: 98.1 F (36.7 C)  TempSrc: Oral  Weight: 219 lb 6.4 oz (99.5 kg)  Height: 5' 5.4" (1.661 m)   Body mass index is 36.06 kg/m.  Wt Readings from Last 3 Encounters:  04/01/20 219 lb 6.4 oz (99.5 kg)  11/06/19 213 lb 12.8 oz (97 kg)  09/01/19 214 lb (97.1 kg)    Objective:  Physical Exam Vitals and nursing note reviewed.  Constitutional:      Appearance: Normal appearance. He is obese.  HENT:     Head: Normocephalic and atraumatic.     Right Ear: Tympanic membrane and ear canal normal. There is impacted cerumen.     Left Ear: Tympanic membrane and ear canal normal. There is impacted cerumen.     Nose:     Comments: Deferred. Masked     Mouth/Throat:     Comments: Deferred. Masked  Eyes:     Extraocular Movements: Extraocular movements intact.     Pupils: Pupils are equal, round, and reactive to light.  Cardiovascular:     Rate and Rhythm: Normal rate and regular rhythm.     Pulses: Normal pulses.     Heart sounds: Normal heart sounds. No murmur heard.   Pulmonary:     Effort: Pulmonary effort is normal. No respiratory distress.     Breath sounds: Normal breath sounds. No wheezing.  Abdominal:     General: Bowel sounds are normal. There is no distension.     Tenderness: There is no abdominal tenderness.  Genitourinary:    Comments: Patient refused prostate exam. Will do PSA  Musculoskeletal:        General: Normal range of motion.     Cervical back: Normal range of motion and neck supple.  Skin:    General: Skin is warm and dry.     Capillary Refill: Capillary refill takes less than 2 seconds.     Comments: Hypopigmented discolored areas at base of neck and upper back. Hyperpigmented  discolored areas underneath bilateral breasts.   Neurological:     General: No focal deficit present.     Mental Status: He is alert and oriented to person, place, and time.  Psychiatric:        Mood and Affect: Mood normal.        Behavior: Behavior normal.         Assessment And Plan:    1. Routine general medical examination at health care facility -Full exam preformed. Patient was advised on getting 30-45 min of regular exercise for atleast 4-5 days a week. Advised to follow a healthy diet. Increase intake of fish and fiber. Decrease intake of red meats and fast foods. Wear SPF 50 sunscreen on exposed parts and when in  direct sunlight for extended periods of time.  - CBC - Hemoglobin A1c - CMP14+EGFR - Lipid panel - PSA  2. Essential hypertension, malignant -Controlled. Continue current meds. - POCT Urinalysis Dipstick (81002) - POCT UA - Microalbumin - EKG 12-Lead  3. Tinea versicolor -Patient has tried OTC selsun blue in the past without much. Patient requested dermatology appt and will also prescribe prescription strength shampoo for patient to try.  - Ambulatory referral to Dermatology - Selenium Sulfide 2.25 % SHAM; Wet the skin, apply selenium sulfide to the affected area(s) and massage gently into a full lather. Leave it for 10 minutes, then rinse off thoroughly. Do this once daily for 7 days.  Dispense: 180 mL; Refill: 0  4. Bilateral impacted cerumen -irrigation with bilateral curette used.   5. Class 2 severe obesity due to excess calories with serious comorbidity and body mass index (BMI) of 36.0 to 36.9 in adult [facility]) -Encouraged patient to exercise for atleast 30-45 min daily. Encouraged a healthy diet with green vegetables and fruits. Increase intake of fish and fiber. Decrease intake of red meats and fatty foods.   Patient was given opportunity to ask questions. Patient verbalized understanding of the plan and was able to repeat key elements of the plan. All  questions were answered to their satisfaction.  Almon Whitford DNP   I, Jerome Castilla DNP have reviewed all documentation for this visit. The documentation on 04/01/20 for the exam, diagnosis, procedures, and orders are all accurate and complete.   THE PATIENT IS ENCOURAGED TO PRACTICE SOCIAL DISTANCING DUE TO THE COVID-19 PANDEMIC.

## 2020-04-02 LAB — CMP14+EGFR
ALT: 32 IU/L (ref 0–44)
AST: 26 IU/L (ref 0–40)
Albumin/Globulin Ratio: 2.1 (ref 1.2–2.2)
Albumin: 4.8 g/dL (ref 3.8–4.8)
Alkaline Phosphatase: 74 IU/L (ref 44–121)
BUN/Creatinine Ratio: 15 (ref 10–24)
BUN: 18 mg/dL (ref 8–27)
Bilirubin Total: 0.8 mg/dL (ref 0.0–1.2)
CO2: 22 mmol/L (ref 20–29)
Calcium: 9.8 mg/dL (ref 8.6–10.2)
Chloride: 102 mmol/L (ref 96–106)
Creatinine, Ser: 1.2 mg/dL (ref 0.76–1.27)
Globulin, Total: 2.3 g/dL (ref 1.5–4.5)
Glucose: 129 mg/dL — ABNORMAL HIGH (ref 65–99)
Potassium: 3.9 mmol/L (ref 3.5–5.2)
Sodium: 140 mmol/L (ref 134–144)
Total Protein: 7.1 g/dL (ref 6.0–8.5)
eGFR: 68 mL/min/{1.73_m2} (ref >59)

## 2020-04-02 LAB — LIPID PANEL
Chol/HDL Ratio: 3.8 ratio (ref 0.0–5.0)
Cholesterol, Total: 173 mg/dL (ref 100–199)
HDL: 46 mg/dL (ref >39)
LDL Chol Calc (NIH): 105 mg/dL — ABNORMAL HIGH (ref 0–99)
Triglycerides: 120 mg/dL (ref 0–149)
VLDL Cholesterol Cal: 22 mg/dL (ref 5–40)

## 2020-04-02 LAB — CBC
Hematocrit: 40.9 % (ref 37.5–51.0)
Hemoglobin: 14.3 g/dL (ref 13.0–17.7)
MCH: 30.5 pg (ref 26.6–33.0)
MCHC: 35 g/dL (ref 31.5–35.7)
MCV: 87 fL (ref 79–97)
Platelets: 228 10*3/uL (ref 150–450)
RBC: 4.69 x10E6/uL (ref 4.14–5.80)
RDW: 13.1 % (ref 11.6–15.4)
WBC: 5.2 10*3/uL (ref 3.4–10.8)

## 2020-04-02 LAB — HEMOGLOBIN A1C
Est. average glucose Bld gHb Est-mCnc: 128 mg/dL
Hgb A1c MFr Bld: 6.1 % — ABNORMAL HIGH (ref 4.8–5.6)

## 2020-04-02 LAB — PSA: Prostate Specific Ag, Serum: 0.8 ng/mL (ref 0.0–4.0)

## 2020-06-14 ENCOUNTER — Ambulatory Visit: Attending: Family

## 2020-06-14 DIAGNOSIS — Z23 Encounter for immunization: Secondary | ICD-10-CM

## 2020-06-14 NOTE — Progress Notes (Signed)
   Covid-19 Vaccination Clinic  Name:  Jerome Pacheco    MRN: [id number] DOB: 02/04/1955  06/14/2020  Jerome Pacheco was observed post Covid-19 immunization for 15 minutes without incident. He was provided with Vaccine Information Sheet and instruction to access the V-Safe system.   Jerome Pacheco was instructed to call 911 with any severe reactions post vaccine: Marland Kitchen Difficulty breathing  . Swelling of face and throat  . A fast heartbeat  . A bad rash all over body  . Dizziness and weakness   Immunizations Administered    Name Date Dose VIS Date Route   Moderna Covid-19 Booster Vaccine 06/14/2020  1:00 AM 0.25 mL 10/29/2019 Intramuscular   Manufacturer: Moderna   Lot: [id number]   NDC: 88891-694-50

## 2020-06-29 ENCOUNTER — Other Ambulatory Visit: Payer: Self-pay | Admitting: Internal Medicine

## 2020-06-29 ENCOUNTER — Ambulatory Visit (INDEPENDENT_AMBULATORY_CARE_PROVIDER_SITE_OTHER): Payer: BC Managed Care – PPO | Admitting: Internal Medicine

## 2020-06-29 ENCOUNTER — Other Ambulatory Visit: Payer: Self-pay

## 2020-06-29 ENCOUNTER — Encounter: Payer: Self-pay | Admitting: Internal Medicine

## 2020-06-29 VITALS — BP 112/66 | HR 69 | Temp 98.1°F | Ht 65.4 in | Wt 217.8 lb

## 2020-06-29 DIAGNOSIS — I10 Essential (primary) hypertension: Secondary | ICD-10-CM

## 2020-06-29 DIAGNOSIS — Z23 Encounter for immunization: Secondary | ICD-10-CM

## 2020-06-29 DIAGNOSIS — R7309 Other abnormal glucose: Secondary | ICD-10-CM | POA: Diagnosis not present

## 2020-06-29 DIAGNOSIS — Z6835 Body mass index (BMI) 35.0-35.9, adult: Secondary | ICD-10-CM

## 2020-06-29 DIAGNOSIS — M25562 Pain in left knee: Secondary | ICD-10-CM

## 2020-06-29 LAB — BMP8+EGFR
BUN/Creatinine Ratio: 16 (ref 10–24)
BUN: 24 mg/dL (ref 8–27)
CO2: 22 mmol/L (ref 20–29)
Calcium: 9.9 mg/dL (ref 8.6–10.2)
Chloride: 100 mmol/L (ref 96–106)
Creatinine, Ser: 1.46 mg/dL — ABNORMAL HIGH (ref 0.76–1.27)
Glucose: 136 mg/dL — ABNORMAL HIGH (ref 65–99)
Potassium: 3.8 mmol/L (ref 3.5–5.2)
Sodium: 143 mmol/L (ref 134–144)
eGFR: 52 mL/min/{1.73_m2} — ABNORMAL LOW (ref >59)

## 2020-06-29 LAB — HEMOGLOBIN A1C
Est. average glucose Bld gHb Est-mCnc: 126 mg/dL
Hgb A1c MFr Bld: 6 % — ABNORMAL HIGH (ref 4.8–5.6)

## 2020-06-29 NOTE — Progress Notes (Signed)
I,Katawbba Wiggins,acting as a Education administrator for Maximino Greenland, MD.,have documented all relevant documentation on the behalf of Maximino Greenland, MD,as directed by  Maximino Greenland, MD while in the presence of Maximino Greenland, MD.  This visit occurred during the SARS-CoV-2 public health emergency.  Safety protocols were in place, including screening questions prior to the visit, additional usage of staff PPE, and extensive cleaning of exam room while observing appropriate contact time as indicated for disinfecting solutions.  Subjective:     Patient ID: Jerome Pacheco , male    DOB: [birth year] , 65 y.o.   MRN: [id number]   Chief Complaint  Patient presents with   Hypertension    HPI  He is here today for bp check. He reports compliance with meds. He denies having any headaches, chest pain and shortness of breath.   Hypertension This is a chronic problem. The current episode started more than 1 year ago. The problem has been gradually improving since onset. The problem is controlled. Risk factors for coronary artery disease include obesity and stress. The current treatment provides moderate improvement. There are no compliance problems.     Past Medical History:  Diagnosis Date   Hypertension    Sleep apnea      Family History  Problem Relation Age of Onset   Heart disease Father    Cancer Mother      Current Outpatient Medications:    carvedilol (COREG) 12.5 MG tablet, Take 1 tablet (12.5 mg total) by mouth 2 (two) times daily with a meal., Disp: 180 tablet, Rfl: 1   colchicine 0.6 MG tablet, Take 1 tablet (0.6 mg total) by mouth daily. (Patient taking differently: Take 0.6 mg by mouth daily. prn), Disp: 30 tablet, Rfl: 1   ibuprofen (ADVIL,MOTRIN) 200 MG tablet, Take 800 mg by mouth every 8 (eight) hours as needed. For pain, Disp: , Rfl:    ketoconazole (NIZORAL) 2 % cream, Apply twice daily for 2 weeks., Disp: 15 g, Rfl: 1   Olmesartan-amLODIPine-HCTZ 40-10-25 MG TABS, TAKE (1)  TABLET BY MOUTH EACH MORNING., Disp: 90 tablet, Rfl: 0   spironolactone (ALDACTONE) 50 MG tablet, Take 1 tablet by mouth once daily, Disp: 90 tablet, Rfl: 2   No Known Allergies   Review of Systems  Constitutional: Negative.   Respiratory: Negative.    Cardiovascular: Negative.   Gastrointestinal: Negative.   Musculoskeletal:  Positive for arthralgias.       He c/o left knee pain. Denies fall/trauma. He plays golf regularly and thinks sx are a result of playing frequently.  Psychiatric/Behavioral: Negative.    All other systems reviewed and are negative.   Today's Vitals   06/29/20 0836  BP: 112/66  Pulse: 69  Temp: 98.1 F (36.7 C)  TempSrc: Oral  Weight: 217 lb 12.8 oz (98.8 kg)  Height: 5' 5.4" (1.661 m)   Body mass index is 35.8 kg/m.  Wt Readings from Last 3 Encounters:  06/29/20 217 lb 12.8 oz (98.8 kg)  04/01/20 219 lb 6.4 oz (99.5 kg)  11/06/19 213 lb 12.8 oz (97 kg)    BP Readings from Last 3 Encounters:  06/29/20 112/66  04/01/20 132/78  11/06/19 120/68    Objective:  Physical Exam Vitals and nursing note reviewed.  Constitutional:      Appearance: Normal appearance.  HENT:     Nose:     Comments: Masked     Mouth/Throat:     Comments: Masked  Eyes:  Extraocular Movements: Extraocular movements intact.  Cardiovascular:     Rate and Rhythm: Normal rate and regular rhythm.     Heart sounds: Normal heart sounds.  Pulmonary:     Effort: Pulmonary effort is normal.     Breath sounds: Normal breath sounds.  Musculoskeletal:     Cervical back: Normal range of motion.  Skin:    General: Skin is warm.  Neurological:     General: No focal deficit present.     Mental Status: He is alert.  Psychiatric:        Mood and Affect: Mood normal.        Assessment And Plan:     1. Essential hypertension, malignant Comments: Chronic, well controlled.  He is encouraged to follow low sodium diet.  - BMP8+EGFR  2. Acute pain of left knee Comments:  Advised to apply Voltaren gel to affected area two to three times daily prn.   3. Other abnormal glucose Comments: He has had elevated hba1c in the past. He is encouraged to limit intake of sweetened beverages, including diet drinks. He will f/u in 4 months.  - Hemoglobin A1c  4. Class 2 severe obesity due to excess calories with serious comorbidity and body mass index (BMI) of 35.0 to 35.9 in adult [facility]) Comments: He is encouraged to initially strive for BMI less than 30 to decrease cardiac risk. He is advised to exercise no less than 150 minutes per week.    5. Immunization due Comments: He is due for Shingrix. We agree that we will wait until the office has in stock.     Patient was given opportunity to ask questions. Patient verbalized understanding of the plan and was able to repeat key elements   I, Maximino Greenland, MD, have reviewed all documentation for this visit. The documentation on 06/29/20 for the exam, diagnosis, procedures, and orders are all accurate and complete.   IF YOU HAVE BEEN REFERRED TO A SPECIALIST, IT MAY TAKE 1-2 WEEKS TO SCHEDULE/PROCESS THE REFERRAL. IF YOU HAVE NOT HEARD FROM US/SPECIALIST IN TWO WEEKS, PLEASE GIVE [place] A CALL AT [phone] X [phone].   THE PATIENT IS ENCOURAGED TO PRACTICE SOCIAL DISTANCING DUE TO THE COVID-19 PANDEMIC.

## 2020-06-29 NOTE — Patient Instructions (Addendum)
The 10-year ASCVD risk score Denman George DC Montez Hageman., et al., 2013) is: 12%   Values used to calculate the score:     Age: 64 years     Sex: Male     Is Non-Hispanic African American: Yes     Diabetic: No     Tobacco smoker: No     Systolic Blood Pressure: 112 mmHg     Is BP treated: Yes     HDL Cholesterol: 46 mg/dL     Total Cholesterol: 173 mg/dL   Cooking With Less Salt Cooking with less salt is one way to reduce the amount of sodium you get from food. Sodium is one of the elements that make up salt. It is found naturally in foods and is also added to certain foods. Depending on your condition and overall health, your health care provider or dietitian may recommend that you reduce your sodium intake. Most people should have less than 2,300 milligrams (mg) of sodium each day. If you have high blood pressure (hypertension), you may need to limit your sodium to 1,500 mg each day. Follow the tipsbelow to help reduce your sodium intake. What are tips for eating less sodium? Reading food labels  Check the food label before buying or using packaged ingredients. Always check the label for the serving size and sodium content. Look for products with no more than 140 mg of sodium in one serving. Check the % Daily Value column to see what percent of the daily recommended amount of sodium is provided in one serving of the product. Foods with 5% or less in this column are considered low in sodium. Foods with 20% or higher are considered high in sodium. Do not choose foods with salt as one of the first three ingredients on the ingredients list. If salt is one of the first three ingredients, it usually means the item is high in sodium.  Shopping Buy sodium-free or low-sodium products. Look for the following words on food labels: Low-sodium. Sodium-free. Reduced-sodium. No salt added. Unsalted. Always check the sodium content even if foods are labeled as low-sodium or no salt added. Buy fresh  foods. Cooking Use herbs, seasonings without salt, and spices as substitutes for salt. Use sodium-free baking soda when baking. Grill, braise, or roast foods to add flavor with less salt. Avoid adding salt to pasta, rice, or hot cereals. Drain and rinse canned vegetables, beans, and meat before use. Avoid adding salt when cooking sweets and desserts. Cook with low-sodium ingredients. What foods are high in sodium? Vegetables Regular canned vegetables (not low-sodium or reduced-sodium). Sauerkraut, pickled vegetables, and relishes. Olives. [place] fries. Onion rings. Regular canned tomato sauce and paste. Regular tomato and vegetable juice. Frozenvegetables in sauces. Grains Instant hot cereals. Bread stuffing, pancake, and biscuit mixes. Croutons. Seasoned rice or pasta mixes. Noodle soup cups. Boxed or frozen macaroni and cheese. Regular salted crackers. Self-rising flour. Rolls. Bagels. Flourtortillas and wraps. Meats and other proteins Meat or fish that is salted, canned, smoked, cured, spiced, or pickled. This includes bacon, ham, sausages, hot dogs, corned beef, chipped beef, meat loaves, salt pork, jerky, pickled herring, anchovies, regular canned tuna, andsardines. Salted nuts. Dairy Processed cheese and cheese spreads. Cheese curds. Blue cheese. Feta cheese.String cheese. Regular cottage cheese. Buttermilk. Canned milk. The items listed above may not be a complete list of foods high in sodium. Actual amounts of sodium may be different depending on processing. Contact a dietitian for more information. What foods are low in sodium? Fruits Fresh,  frozen, or canned fruit with no sauce added. Fruit juice. Vegetables Fresh or frozen vegetables with no sauce added. "No salt added" canned vegetables. "No salt added" tomato sauce and paste. Low-sodium orreduced-sodium tomato and vegetable juice. Grains Noodles, pasta, quinoa, rice. Shredded or puffed wheat or puffed rice. Regular or quick oats  (not instant). Low-sodium crackers. Low-sodium bread. Whole-grainbread and whole-grain pasta. Unsalted popcorn. Meats and other proteins Fresh or frozen whole meats, poultry (not injected with sodium), and fish with no sauce added. Unsalted nuts. Dried peas, beans, and lentils without added salt. Unsalted canned beans. Eggs. Unsalted nut butters. Low-sodium canned tunaor chicken. Dairy Milk. Soy milk. Yogurt. Low-sodium cheeses, such as Swiss, [address], [place], and [employer]. Sherbet or ice cream (keep to  cup per serving).Cream cheese. Fats and oils Unsalted butter or margarine. Other foods Homemade pudding. Sodium-free baking soda and baking powder. Herbs and spices.Low-sodium seasoning mixes. Beverages Coffee and tea. Carbonated beverages. The items listed above may not be a complete list of foods low in sodium. Actual amounts of sodium may be different depending on processing. Contact a dietitian for more information. What are some salt alternatives when cooking? The following are herbs, seasonings, and spices that can be used instead of salt to flavor your food. Herbs should be fresh or dried. Do not choose packaged mixes. Next to the name of the herb, spice, or seasoning aresome examples of foods you can pair it with. Herbs [place] leaves - Soups, meat and vegetable dishes, and spaghetti sauce. Basil - [employer], soups, pasta, and fish dishes. Cilantro - Meat, poultry, and vegetable dishes. Chili powder - Marinades and Mexican dishes. Chives - Salad dressings and potato dishes. Cumin - Mexican dishes, couscous, and meat dishes. Dill - Fish dishes, sauces, and salads. Fennel - Meat and vegetable dishes, breads, and cookies. Garlic (do not use garlic salt) - [place] dishes, meat dishes, salad dressings, and sauces. Marjoram - Soups, potato dishes, and meat dishes. Oregano - Pizza and spaghetti sauce. Parsley - Salads, soups, pasta, and meat dishes. Rosemary - [place] dishes,  salad dressings, soups, and red meats. Saffron - Fish dishes, pasta, and some poultry dishes. Sage - Stuffings and sauces. Tarragon - Fish and Whole Foods. Thyme - Stuffing, meat, and fish dishes. Seasonings Lemon juice - Fish dishes, poultry dishes, vegetables, and salads. Vinegar - Salad dressings, vegetables, and fish dishes. Spices Cinnamon - Sweet dishes, such as cakes, cookies, and puddings. Cloves - Gingerbread, puddings, and marinades for meats. Curry - Vegetable dishes, fish and poultry dishes, and stir-fry dishes. Ginger - Vegetable dishes, fish dishes, and stir-fry dishes. Nutmeg - Pasta, vegetables, poultry, fish dishes, and custard. Summary Cooking with less salt is one way to reduce the amount of sodium that you get from food. Buy sodium-free or low-sodium products. Check the food label before using or buying packaged ingredients. Use herbs, seasonings without salt, and spices as substitutes for salt in foods. This information is not intended to replace advice given to you by your health care provider. Make sure you discuss any questions you have with your healthcare provider. Document Revised: 12/18/2018 Document Reviewed: 12/18/2018 Elsevier Patient Education  2022 [employer].

## 2020-08-09 ENCOUNTER — Other Ambulatory Visit: Payer: Self-pay | Admitting: Internal Medicine

## 2020-08-16 ENCOUNTER — Encounter: Payer: Self-pay | Admitting: Internal Medicine

## 2020-08-16 ENCOUNTER — Ambulatory Visit: Payer: BC Managed Care – PPO | Admitting: Internal Medicine

## 2020-08-16 ENCOUNTER — Other Ambulatory Visit: Payer: Self-pay

## 2020-08-16 VITALS — BP 130/80 | HR 64 | Temp 98.6°F | Ht 65.0 in | Wt 220.2 lb

## 2020-08-16 DIAGNOSIS — Z6836 Body mass index (BMI) 36.0-36.9, adult: Secondary | ICD-10-CM

## 2020-08-16 DIAGNOSIS — L84 Corns and callosities: Secondary | ICD-10-CM | POA: Diagnosis not present

## 2020-08-16 DIAGNOSIS — Z23 Encounter for immunization: Secondary | ICD-10-CM | POA: Diagnosis not present

## 2020-08-16 DIAGNOSIS — B360 Pityriasis versicolor: Secondary | ICD-10-CM | POA: Diagnosis not present

## 2020-08-16 DIAGNOSIS — M1A3720 Chronic gout due to renal impairment, left ankle and foot, without tophus (tophi): Secondary | ICD-10-CM | POA: Diagnosis not present

## 2020-08-16 DIAGNOSIS — M545 Low back pain, unspecified: Secondary | ICD-10-CM | POA: Diagnosis not present

## 2020-08-16 DIAGNOSIS — R82998 Other abnormal findings in urine: Secondary | ICD-10-CM

## 2020-08-16 MED ORDER — ZOSTER VAC RECOMB ADJUVANTED 50 MCG/0.5ML IM SUSR: 0.5000 mL | Freq: Once | INTRAMUSCULAR | 0 refills | Status: AC

## 2020-08-16 MED ORDER — CYCLOBENZAPRINE HCL 5 MG PO TABS: 5.0000 mg | ORAL_TABLET | Freq: Three times a day (TID) | ORAL | 0 refills | Status: DC | PRN

## 2020-08-16 MED ORDER — ALLOPURINOL 100 MG PO TABS: 100.0000 mg | ORAL_TABLET | Freq: Every day | ORAL | 2 refills | Status: DC

## 2020-08-16 NOTE — Patient Instructions (Signed)
Exercising to Lose Weight Exercise is structured, repetitive physical activity to improve fitness and health. Getting regular exercise is important for everyone. It is especially important if you are overweight. Being overweight increases your risk of heart disease, stroke, diabetes, high blood pressure, and several types of cancer.Reducing your calorie intake and exercising can help you lose weight. Exercise is usually categorized as moderate or vigorous intensity. To lose weight, most people need to do a certain amount of moderate-intensity orvigorous-intensity exercise each week. Moderate-intensity exercise  Moderate-intensity exercise is any activity that gets you moving enough to burn at least three times more energy (calories) than if you were sitting. Examples of moderate exercise include: Walking a mile in 15 minutes. Doing light yard work. Biking at an easy pace. Most people should get at least 150 minutes (2 hours and 30 minutes) a week ofmoderate-intensity exercise to maintain their body weight. Vigorous-intensity exercise Vigorous-intensity exercise is any activity that gets you moving enough to burn at least six times more calories than if you were sitting. When you exercise at this intensity, you should be working hard enough that you are not able tocarry on a conversation. Examples of vigorous exercise include: Running. Playing a team sport, such as football, basketball, and soccer. Jumping rope. Most people should get at least 75 minutes (1 hour and 15 minutes) a week ofvigorous-intensity exercise to maintain their body weight. How can exercise affect me? When you exercise enough to burn more calories than you eat, you lose weight. Exercise also reduces body fat and builds muscle. The more muscle you have, the more calories you burn. Exercise also: Improves mood. Reduces stress and tension. Improves your overall fitness, flexibility, and endurance. Increases bone strength. The  amount of exercise you need to lose weight depends on: Your age. The type of exercise. Any health conditions you have. Your overall physical ability. Talk to your health care provider about how much exercise you need and whattypes of activities are safe for you. What actions can I take to lose weight? Nutrition  Make changes to your diet as told by your health care provider or diet and nutrition specialist (dietitian). This may include: Eating fewer calories. Eating more protein. Eating less unhealthy fats. Eating a diet that includes fresh fruits and vegetables, whole grains, low-fat dairy products, and lean protein. Avoiding foods with added fat, salt, and sugar. Drink plenty of water while you exercise to prevent dehydration or heat stroke.  Activity Choose an activity that you enjoy and set realistic goals. Your health care provider can help you make an exercise plan that works for you. Exercise at a moderate or vigorous intensity most days of the week. The intensity of exercise may vary from person to person. You can tell how intense a workout is for you by paying attention to your breathing and heartbeat. Most people will notice their breathing and heartbeat get faster with more intense exercise. Do resistance training twice each week, such as: Push-ups. Sit-ups. Lifting weights. Using resistance bands. Getting short amounts of exercise can be just as helpful as long structured periods of exercise. If you have trouble finding time to exercise, try to include exercise in your daily routine. Get up, stretch, and walk around every 30 minutes throughout the day. Go for a walk during your lunch break. Park your car farther away from your destination. If you take public transportation, get off one stop early and walk the rest of the way. Make phone calls while standing up and   walking around. Take the stairs instead of elevators or escalators. Wear comfortable clothes and shoes with  good support. Do not exercise so much that you hurt yourself, feel dizzy, or get very short of breath. Where to find more information [place] Department of Health and Human Services: www.hhs.gov Centers for Disease Control and Prevention (CDC): www.cdc.gov Contact a health care provider: Before starting a new exercise program. If you have questions or concerns about your weight. If you have a medical problem that keeps you from exercising. Get help right away if you have any of the following while exercising: Injury. Dizziness. Difficulty breathing or shortness of breath that does not go away when you stop exercising. Chest pain. Rapid heartbeat. Summary Being overweight increases your risk of heart disease, stroke, diabetes, high blood pressure, and several types of cancer. Losing weight happens when you burn more calories than you eat. Reducing the amount of calories you eat in addition to getting regular moderate or vigorous exercise each week helps you lose weight. This information is not intended to replace advice given to you by your health care provider. Make sure you discuss any questions you have with your healthcare provider. Document Revised: 04/07/2019 Document Reviewed: 04/24/2019 Elsevier Patient Education  2022 [employer]  

## 2020-08-16 NOTE — Progress Notes (Signed)
I,Yamilka [employer] as a Education administrator for Maximino Greenland, MD.,have documented all relevant documentation on the behalf of Maximino Greenland, MD,as directed by  Maximino Greenland, MD while in the presence of Maximino Greenland, MD.  This visit occurred during the SARS-CoV-2 public health emergency.  Safety protocols were in place, including screening questions prior to the visit, additional usage of staff PPE, and extensive cleaning of exam room while observing appropriate contact time as indicated for disinfecting solutions.  Subjective:     Patient ID: Jerome Pacheco , male    DOB: October 08, 1955 , 65 y.o.   MRN: [id number]   Chief Complaint  Patient presents with   Back Pain    HPI  Patient presents today for back pain. His symptoms started within the last two weeks or so. Denies fall/trauma. Denies LE weakness/paresthesias. There is pain with certain movements. There is no fecal/urinary incontinence.   Back Pain This is a new problem. The problem occurs constantly. The problem has been gradually improving since onset. The pain is present in the lumbar spine. The quality of the pain is described as stabbing and shooting. The pain does not radiate. The pain is at a severity of 5/10. The pain is moderate. The symptoms are aggravated by bending and twisting. Stiffness is present All day. He has tried NSAIDs for the symptoms. The treatment provided moderate relief.    Past Medical History:  Diagnosis Date   Hypertension    Sleep apnea      Family History  Problem Relation Age of Onset   Heart disease Father    Cancer Mother      Current Outpatient Medications:    allopurinol (ZYLOPRIM) 100 MG tablet, Take 1 tablet (100 mg total) by mouth daily., Disp: 30 tablet, Rfl: 2   carvedilol (COREG) 12.5 MG tablet, Take 1 tablet (12.5 mg total) by mouth 2 (two) times daily with a meal., Disp: 180 tablet, Rfl: 1   colchicine 0.6 MG tablet, Take 1 tablet (0.6 mg total) by mouth daily. (Patient taking  differently: Take 0.6 mg by mouth daily. prn), Disp: 30 tablet, Rfl: 1   cyclobenzaprine (FLEXERIL) 5 MG tablet, Take 1 tablet (5 mg total) by mouth 3 (three) times daily as needed for muscle spasms., Disp: 30 tablet, Rfl: 0   ibuprofen (ADVIL,MOTRIN) 200 MG tablet, Take 800 mg by mouth every 8 (eight) hours as needed. For pain, Disp: , Rfl:    ketoconazole (NIZORAL) 2 % cream, Apply twice daily for 2 weeks., Disp: 15 g, Rfl: 1   Olmesartan-amLODIPine-HCTZ 40-10-25 MG TABS, TAKE (1) TABLET BY MOUTH EACH MORNING., Disp: 90 tablet, Rfl: 0   spironolactone (ALDACTONE) 50 MG tablet, Take 1 tablet by mouth once daily, Disp: 90 tablet, Rfl: 2   Zoster Vaccine Adjuvanted (SHINGRIX) injection, Inject 0.5 mLs into the muscle once for 1 dose., Disp: 0.5 mL, Rfl: 0   No Known Allergies   Review of Systems  Constitutional: Negative.   Respiratory: Negative.    Cardiovascular: Negative.   Gastrointestinal: Negative.   Musculoskeletal:  Positive for arthralgias and back pain.       Additionally, he admits to have a severe gouty attack July 4th weekend. States his sister was in town and they ate a lot of seafood. He is fully aware what triggered his sx. He has noticed there is increased frequency/severity of his gouty symptoms. He took two colchicine and developed severe diarrhea. He reports it took him several days to recover from  the diarrhea, n/v caused by the colchicine.   Neurological: Negative.   Psychiatric/Behavioral: Negative.      Today's Vitals   08/16/20 1104  BP: 130/80  Pulse: 64  Temp: 98.6 F (37 C)  Weight: 220 lb 3.2 oz (99.9 kg)  Height: 5' 5"  (1.651 m)  PainSc: 0-No pain   Body mass index is 36.64 kg/m.  Wt Readings from Last 3 Encounters:  08/16/20 220 lb 3.2 oz (99.9 kg)  06/29/20 217 lb 12.8 oz (98.8 kg)  04/01/20 219 lb 6.4 oz (99.5 kg)     Objective:  Physical Exam Vitals and nursing note reviewed.  Constitutional:      Appearance: Normal appearance.  HENT:      Head: Normocephalic and atraumatic.     Nose:     Comments: Masked     Mouth/Throat:     Comments: Masked  Eyes:     Extraocular Movements: Extraocular movements intact.  Cardiovascular:     Rate and Rhythm: Normal rate and regular rhythm.     Heart sounds: Normal heart sounds.  Pulmonary:     Effort: Pulmonary effort is normal.     Breath sounds: Normal breath sounds.  Musculoskeletal:        General: Tenderness present.     Cervical back: Normal range of motion.     Comments: Decreased movement due to pain/stiffness  Skin:    General: Skin is warm.     Comments: Callus palmar side of left hand  Neurological:     General: No focal deficit present.     Mental Status: He is alert.  Psychiatric:        Mood and Affect: Mood normal.        Assessment And Plan:     1. Acute left-sided low back pain without sciatica Comments: He was given some stretching exercises to perform. I will send rx cyclobenzaprine to take prn.  I will also refer him to [facility] for further evaluation.  - Ambulatory referral to Chiropractic  2. 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 We discussed starting allopurinol 146m daily. I will make further recommendations once his labs are available due to increased frequency/severity of his sx.  - Uric acid - BMP8+eGFR  3. Callus Comments: I will refer him to Derm for further eval.  - Ambulatory referral to Dermatology  4. Tinea versicolor Advised to use Selsun blue shampoo as well on torso.  - Ambulatory referral to Dermatology  5. Foamy urine Comments: I planned to check u/a; however, it appears specimen was not run. I will check next week during his nurse visit.   6. Class 2 severe obesity due to excess calories with serious comorbidity and body mass index (BMI) of 36.0 to 36.9 in adult ([facility] Comments: After his visit, he told staff he was ready to resume [place]  Advised he should return to office next week [facility]. Denies fam hx of thyroid cancer.   7. Immunization due Comments: He was given Shingrix IM x 1. He needs repeat vaccine in 2-6 months.  - Zoster Vaccine Adjuvanted ([facility] injection; Inject 0.5 mLs into the muscle once for 1 dose.  Dispense: 0.5 mL; Refill: 0 - Varicella-zoster vaccine IM (Shingrix)    Patient was given opportunity to ask questions. Patient verbalized understanding of the plan and was able to repeat key elements of the plan. All questions  were answered to their satisfaction.   I, Maximino Greenland, MD, have reviewed all documentation for this visit. The documentation on 08/16/20 for the exam, diagnosis, procedures, and orders are all accurate and complete.   IF YOU HAVE BEEN REFERRED TO A SPECIALIST, IT MAY TAKE 1-2 WEEKS TO SCHEDULE/PROCESS THE REFERRAL. IF YOU HAVE NOT HEARD FROM US/SPECIALIST IN TWO WEEKS, PLEASE GIVE [place] A CALL AT [phone].   THE PATIENT IS ENCOURAGED TO PRACTICE SOCIAL DISTANCING DUE TO THE COVID-19 PANDEMIC.

## 2020-08-17 ENCOUNTER — Other Ambulatory Visit: Payer: Self-pay

## 2020-08-17 ENCOUNTER — Ambulatory Visit: Payer: Self-pay

## 2020-08-17 LAB — BMP8+EGFR
BUN/Creatinine Ratio: 16 (ref 10–24)
BUN: 19 mg/dL (ref 8–27)
CO2: 20 mmol/L (ref 20–29)
Calcium: 9.8 mg/dL (ref 8.6–10.2)
Chloride: 104 mmol/L (ref 96–106)
Creatinine, Ser: 1.19 mg/dL (ref 0.76–1.27)
Glucose: 98 mg/dL (ref 65–99)
Potassium: 3.8 mmol/L (ref 3.5–5.2)
Sodium: 144 mmol/L (ref 134–144)
eGFR: 68 mL/min/{1.73_m2} (ref >59)

## 2020-08-17 LAB — URIC ACID: Uric Acid: 8.2 mg/dL (ref 3.8–8.4)

## 2020-08-17 MED ORDER — SAXENDA 18 MG/3ML ~~LOC~~ SOPN: 3.0000 mg | PEN_INJECTOR | Freq: Every day | SUBCUTANEOUS | 1 refills | Status: DC

## 2020-08-17 MED ORDER — SAXENDA 18 MG/3ML ~~LOC~~ SOPN
3.0000 mg | PEN_INJECTOR | Freq: Every day | SUBCUTANEOUS | 1 refills | Status: DC
Start: 2020-08-17 — End: 2020-08-17

## 2020-08-17 NOTE — Telephone Encounter (Signed)
The pt was called and scheduled an appt for a nurse visit.  The pt is restarting Saxenda.

## 2020-08-18 ENCOUNTER — Other Ambulatory Visit: Payer: Self-pay | Admitting: Internal Medicine

## 2020-08-23 LAB — PROTEIN ELECTROPHORESIS
A/G Ratio: 1.3 (ref 0.7–1.7)
Albumin ELP: 4.1 g/dL (ref 2.9–4.4)
Alpha 1: 0.2 g/dL (ref 0.0–0.4)
Alpha 2: 0.6 g/dL (ref 0.4–1.0)
Beta: 1 g/dL (ref 0.7–1.3)
Gamma Globulin: 1.3 g/dL (ref 0.4–1.8)
Globulin, Total: 3.1 g/dL (ref 2.2–3.9)
Total Protein: 7.2 g/dL (ref 6.0–8.5)

## 2020-08-23 LAB — SPECIMEN STATUS REPORT

## 2020-08-24 ENCOUNTER — Other Ambulatory Visit: Payer: Self-pay

## 2020-08-24 ENCOUNTER — Ambulatory Visit

## 2020-08-24 VITALS — BP 132/84 | HR 78 | Temp 98.5°F | Ht 65.0 in | Wt 216.8 lb

## 2020-08-24 DIAGNOSIS — Z6835 Body mass index (BMI) 35.0-35.9, adult: Secondary | ICD-10-CM

## 2020-08-24 NOTE — Progress Notes (Signed)
Patient presents today for a teaching on Saxenda.

## 2020-09-08 ENCOUNTER — Ambulatory Visit: Payer: BC Managed Care – PPO | Admitting: Physician Assistant

## 2020-09-15 ENCOUNTER — Other Ambulatory Visit: Payer: Self-pay | Admitting: Internal Medicine

## 2020-10-30 ENCOUNTER — Other Ambulatory Visit: Payer: Self-pay

## 2020-10-30 ENCOUNTER — Emergency Department (HOSPITAL_COMMUNITY)
Admission: EM | Admit: 2020-10-30 | Discharge: 2020-10-30 | Disposition: A | Discharge status: Home / Self Care | Payer: BC Managed Care – PPO | Attending: Emergency Medicine | Admitting: Emergency Medicine

## 2020-10-30 ENCOUNTER — Emergency Department (HOSPITAL_COMMUNITY): Payer: BC Managed Care – PPO

## 2020-10-30 ENCOUNTER — Encounter (HOSPITAL_COMMUNITY): Payer: Self-pay

## 2020-10-30 DIAGNOSIS — M25521 Pain in right elbow: Secondary | ICD-10-CM | POA: Diagnosis present

## 2020-10-30 DIAGNOSIS — E669 Obesity, unspecified: Secondary | ICD-10-CM | POA: Insufficient documentation

## 2020-10-30 DIAGNOSIS — I10 Essential (primary) hypertension: Secondary | ICD-10-CM | POA: Diagnosis not present

## 2020-10-30 DIAGNOSIS — Z79899 Other long term (current) drug therapy: Secondary | ICD-10-CM | POA: Insufficient documentation

## 2020-10-30 LAB — CBC WITH DIFFERENTIAL/PLATELET
Abs Immature Granulocytes: 0.03 10*3/uL (ref 0.00–0.07)
Basophils Absolute: 0 10*3/uL (ref 0.0–0.1)
Basophils Relative: 0 %
Eosinophils Absolute: 0.2 10*3/uL (ref 0.0–0.5)
Eosinophils Relative: 3 %
HCT: 40.2 % (ref 39.0–52.0)
Hemoglobin: 13.6 g/dL (ref 13.0–17.0)
Immature Granulocytes: 0 %
Lymphocytes Relative: 26 %
Lymphs Abs: 1.9 10*3/uL (ref 0.7–4.0)
MCH: 29.5 pg (ref 26.0–34.0)
MCHC: 33.8 g/dL (ref 30.0–36.0)
MCV: 87.2 fL (ref 80.0–100.0)
Monocytes Absolute: 0.6 10*3/uL (ref 0.1–1.0)
Monocytes Relative: 8 %
Neutro Abs: 4.8 10*3/uL (ref 1.7–7.7)
Neutrophils Relative %: 63 %
Platelets: 281 10*3/uL (ref 150–400)
RBC: 4.61 MIL/uL (ref 4.22–5.81)
RDW: 13 % (ref 11.5–15.5)
WBC: 7.6 10*3/uL (ref 4.0–10.5)
nRBC: 0 % (ref 0.0–0.2)

## 2020-10-30 LAB — BASIC METABOLIC PANEL
Anion gap: 11 (ref 5–15)
BUN: 21 mg/dL (ref 8–23)
CO2: 25 mmol/L (ref 22–32)
Calcium: 9.4 mg/dL (ref 8.9–10.3)
Chloride: 104 mmol/L (ref 98–111)
Creatinine, Ser: 1.11 mg/dL (ref 0.61–1.24)
GFR, Estimated: >60 mL/min (ref >60)
Glucose, Bld: 117 mg/dL — ABNORMAL HIGH (ref 70–99)
Potassium: 3.5 mmol/L (ref 3.5–5.1)
Sodium: 140 mmol/L (ref 135–145)

## 2020-10-30 LAB — URIC ACID: Uric Acid, Serum: 5.7 mg/dL (ref 3.7–8.6)

## 2020-10-30 LAB — CK: Total CK: 181 U/L (ref 49–397)

## 2020-10-30 MED ORDER — HYDROCODONE-ACETAMINOPHEN 5-325 MG PO TABS
1.0000 | ORAL_TABLET | Freq: Once | ORAL | Status: AC
  Administered 2020-10-30: 1 via ORAL
  Filled 2020-10-30: qty 1

## 2020-10-30 MED ORDER — IBUPROFEN 600 MG PO TABS: 600.0000 mg | ORAL_TABLET | Freq: Four times a day (QID) | ORAL | 0 refills | Status: AC | PRN

## 2020-10-30 MED ORDER — IBUPROFEN 400 MG PO TABS
600.0000 mg | ORAL_TABLET | Freq: Once | ORAL | Status: AC
  Administered 2020-10-30: 600 mg via ORAL
  Filled 2020-10-30: qty 1

## 2020-10-30 MED ORDER — PREDNISONE 10 MG (21) PO TBPK
ORAL_TABLET | Freq: Every day | ORAL | 0 refills | Status: DC
Start: 2020-10-30 — End: 2021-02-08

## 2020-10-30 MED ORDER — MORPHINE SULFATE (PF) 4 MG/ML IV SOLN
4.0000 mg | Freq: Once | INTRAVENOUS | Status: AC
  Administered 2020-10-30: 4 mg via INTRAMUSCULAR
  Filled 2020-10-30: qty 1

## 2020-10-30 MED ORDER — DEXAMETHASONE SODIUM PHOSPHATE 10 MG/ML IJ SOLN
10.0000 mg | Freq: Once | INTRAMUSCULAR | Status: AC
  Administered 2020-10-30: 10 mg via INTRAMUSCULAR
  Filled 2020-10-30: qty 1

## 2020-10-30 MED ORDER — HYDROCODONE-ACETAMINOPHEN 5-325 MG PO TABS: 1.0000 | ORAL_TABLET | ORAL | 0 refills | Status: DC | PRN

## 2020-10-30 NOTE — ED Notes (Signed)
Pt sitting in chair, pt c/o R elbow pain and swelling for the past few days, arm it tender to touch and with movement.  Pt coloration is different from his R to L arm, pt denies trauma, pt awaits med eval

## 2020-10-30 NOTE — ED Provider Notes (Signed)
[facility] EMERGENCY DEPARTMENT Provider Note   CSN: [id number] Arrival date & time: 10/30/20  8242     History No chief complaint on file.   Jerome Pacheco is a 65 y.o. male.  Pt presents to the ED today with right elbow pain.  Pt said he has been having pain since awakening on Thursday, 10/20.  Pt said he has a hx of gout, but this feels different.  He has pain throughout his arm, but feels like it originates in his elbow.  He has a lot of pain with movement of his elbow and a lot of pain to touch.      Past Medical History:  Diagnosis Date   Hypertension    Sleep apnea     Patient Active Problem List   Diagnosis Date Noted   Tinea versicolor 07/31/2017   Hypertension, essential 07/31/2017   OSA (obstructive sleep apnea) 01/29/2013    Past Surgical History:  Procedure Laterality Date   APPENDECTOMY  1993   CHOLECYSTECTOMY  2010       Family History  Problem Relation Age of Onset   Heart disease Father    Cancer Mother     Social History   Tobacco Use   Smoking status: Never   Smokeless tobacco: Never  Vaping Use   Vaping Use: Never used  Substance Use Topics   Alcohol use: No    Comment: quit 2012   Drug use: No    Home Medications Prior to Admission medications   Medication Sig Start Date End Date Taking? Authorizing Provider  HYDROcodone-acetaminophen (NORCO/VICODIN) 5-325 MG tablet Take 1 tablet by mouth every 4 (four) hours as needed. 10/30/20  Yes Jacalyn Lefevre, MD  ibuprofen (ADVIL) 600 MG tablet Take 1 tablet (600 mg total) by mouth every 6 (six) hours as needed. 10/30/20  Yes Jacalyn Lefevre, MD  predniSONE (STERAPRED UNI-PAK 21 TAB) 10 MG (21) TBPK tablet Take by mouth daily. Take 6 tabs by mouth daily  for 2 days, then 5 tabs for 2 days, then 4 tabs for 2 days, then 3 tabs for 2 days, 2 tabs for 2 days, then 1 tab by mouth daily for 2 days 10/30/20  Yes Jacalyn Lefevre, MD  allopurinol (ZYLOPRIM) 100 MG tablet Take 1  tablet (100 mg total) by mouth daily. 08/16/20 08/16/21  Dorothyann Peng, MD  carvedilol (COREG) 12.5 MG tablet Take 1 tablet (12.5 mg total) by mouth 2 (two) times daily with a meal. 04/01/20   Dorothyann Peng, MD  colchicine 0.6 MG tablet Take 1 tablet (0.6 mg total) by mouth daily. Patient taking differently: Take 0.6 mg by mouth daily. prn 07/24/19 08/16/20  Dorothyann Peng, MD  cyclobenzaprine (FLEXERIL) 5 MG tablet Take 1 tablet (5 mg total) by mouth 3 (three) times daily as needed for muscle spasms. 08/16/20   Dorothyann Peng, MD  ketoconazole (NIZORAL) 2 % cream Apply twice daily for 2 weeks. 04/01/20   Charlesetta Ivory, NP  Liraglutide -Weight Management (SAXENDA) 18 MG/3ML SOPN Inject 3 mg into the skin daily. 08/17/20   Dorothyann Peng, MD  Olmesartan-amLODIPine-HCTZ 40-10-25 MG TABS TAKE (1) TABLET BY MOUTH EACH MORNING. 08/09/20   Dorothyann Peng, MD  spironolactone (ALDACTONE) 50 MG tablet Take 1 tablet by mouth once daily 09/15/20   Dorothyann Peng, MD    Allergies    Patient has no known allergies.  Review of Systems   Review of Systems  Musculoskeletal:        Right arm pain  All other systems reviewed and are negative.  Physical Exam Updated Vital Signs BP 106/78 (BP Location: Left Arm)   Pulse 78   Temp 97.8 F (36.6 C) (Oral)   Resp 17   SpO2 99%   Physical Exam Vitals and nursing note reviewed.  Constitutional:      Appearance: Normal appearance. He is obese.  HENT:     Head: Normocephalic and atraumatic.     Right Ear: External ear normal.     Left Ear: External ear normal.     Nose: Nose normal.     Mouth/Throat:     Mouth: Mucous membranes are moist.     Pharynx: Oropharynx is clear.  Eyes:     Extraocular Movements: Extraocular movements intact.     Conjunctiva/sclera: Conjunctivae normal.     Pupils: Pupils are equal, round, and reactive to light.  Cardiovascular:     Rate and Rhythm: Normal rate and regular rhythm.     Pulses: Normal pulses.     Heart sounds:  Normal heart sounds.  Pulmonary:     Effort: Pulmonary effort is normal.     Breath sounds: Normal breath sounds.  Abdominal:     General: Abdomen is flat. Bowel sounds are normal.     Palpations: Abdomen is soft.  Musculoskeletal:     Right elbow: Decreased range of motion. Tenderness present.     Cervical back: Normal range of motion and neck supple.  Skin:    General: Skin is warm.     Capillary Refill: Capillary refill takes less than 2 seconds.  Neurological:     General: No focal deficit present.     Mental Status: He is alert and oriented to person, place, and time.  Psychiatric:        Mood and Affect: Mood normal.        Behavior: Behavior normal.    ED Results / Procedures / Treatments   Labs (all labs ordered are listed, but only abnormal results are displayed) Labs Reviewed  BASIC METABOLIC PANEL - Abnormal; Notable for the following components:      Result Value   Glucose, Bld 117 (*)    All other components within normal limits  CBC WITH DIFFERENTIAL/PLATELET  CK  URIC ACID    EKG None  Radiology DG Elbow Complete Right  Result Date: 10/30/2020 CLINICAL DATA:  pain EXAM: RIGHT ELBOW - COMPLETE 3+ VIEW COMPARISON:  None. FINDINGS: No acute fracture or dislocation. Joint spaces and alignment are maintained. No area of erosion or osseous destruction. No unexpected radiopaque foreign body. Soft tissues are unremarkable. IMPRESSION: No acute fracture or dislocation. Electronically Signed   By: Meda Klinefelter M.D.   On: 10/30/2020 13:41    Procedures Procedures   Medications Ordered in ED Medications  ibuprofen (ADVIL) tablet 600 mg (has no administration in time range)  HYDROcodone-acetaminophen (NORCO/VICODIN) 5-325 MG per tablet 1 tablet (has no administration in time range)  dexamethasone (DECADRON) injection 10 mg (10 mg Intramuscular Given 10/30/20 1205)  morphine 4 MG/ML injection 4 mg (4 mg Intramuscular Given 10/30/20 1207)    ED Course  I  have reviewed the triage vital signs and the nursing notes.  Pertinent labs & imaging results that were available during my care of the patient were reviewed by me and considered in my medical decision making (see chart for details).    MDM Rules/Calculators/A&P  Xray neg.  Pain improved after pain meds, but he felt nauseous.  He had not eaten anything today, so he was given some crackers and some water.  Nausea improved after the crackers.  Pt said the xray made his pain come back, so he is given additional meds.  Labs unremarkable.  Pt is d/c with prednisone and ibuprofen/lortab.  He is to f/u with ortho.  Return if worse.   Final Clinical Impression(s) / ED Diagnoses Final diagnoses:  Elbow pain, right    Rx / DC Orders ED Discharge Orders          Ordered    predniSONE (STERAPRED UNI-PAK 21 TAB) 10 MG (21) TBPK tablet  Daily        10/30/20 1348    ibuprofen (ADVIL) 600 MG tablet  Every 6 hours PRN        10/30/20 1348    HYDROcodone-acetaminophen (NORCO/VICODIN) 5-325 MG tablet  Every 4 hours PRN        10/30/20 1348             Jacalyn Lefevre, MD 10/30/20 1350

## 2020-10-30 NOTE — ED Triage Notes (Signed)
Patient awoke on Thursday am with arm pain from shoulder to fingers. Pain with palpation to right elbow and wrist, swelling noted and has hx of gout.

## 2020-10-30 NOTE — ED Notes (Signed)
Pt in chair, pt c/o sudden onset of burning abd pain. Md notified.  Informed pt that md would be to see him soon, pt is now pale and continues to say he isn't feeling well, reclined pt in chair and provided cool washcloth to forehead, vital signs wdl.  Pt states that he is starting to feel better.

## 2020-10-30 NOTE — ED Notes (Signed)
Pt sitting in chair, pt reports decreased pain and increased movement.

## 2020-10-30 NOTE — ED Notes (Signed)
Pt color is wdl, pt states that he is feeling better, pt has no requests at this time.

## 2020-11-02 ENCOUNTER — Encounter: Payer: Self-pay | Admitting: Internal Medicine

## 2020-11-02 ENCOUNTER — Ambulatory Visit (INDEPENDENT_AMBULATORY_CARE_PROVIDER_SITE_OTHER): Payer: BC Managed Care – PPO | Admitting: Internal Medicine

## 2020-11-02 ENCOUNTER — Other Ambulatory Visit: Payer: Self-pay

## 2020-11-02 VITALS — BP 124/78 | HR 95 | Temp 98.3°F | Ht 65.0 in | Wt 218.2 lb

## 2020-11-02 DIAGNOSIS — Z23 Encounter for immunization: Secondary | ICD-10-CM

## 2020-11-02 DIAGNOSIS — M7031 Other bursitis of elbow, right elbow: Secondary | ICD-10-CM | POA: Diagnosis not present

## 2020-11-02 DIAGNOSIS — R7309 Other abnormal glucose: Secondary | ICD-10-CM | POA: Diagnosis not present

## 2020-11-02 DIAGNOSIS — I10 Essential (primary) hypertension: Secondary | ICD-10-CM | POA: Diagnosis not present

## 2020-11-02 DIAGNOSIS — Z6836 Body mass index (BMI) 36.0-36.9, adult: Secondary | ICD-10-CM

## 2020-11-02 NOTE — Patient Instructions (Signed)
Cooking With Less Salt Cooking with less salt is one way to reduce the amount of sodium you get from food. Sodium is one of the elements that make up salt. It is found naturally in foods and is also added to certain foods. Depending on your condition and overall health, your health care provider or dietitian may recommend that you reduce your sodium intake. Most people should have less than 2,300 milligrams (mg) of sodium each day. If you have high blood pressure (hypertension), you may need to limit your sodium to 1,500 mg each day. Follow the tipsbelow to help reduce your sodium intake. What are tips for eating less sodium? Reading food labels  Check the food label before buying or using packaged ingredients. Always check the label for the serving size and sodium content. Look for products with no more than 140 mg of sodium in one serving. Check the % Daily Value column to see what percent of the daily recommended amount of sodium is provided in one serving of the product. Foods with 5% or less in this column are considered low in sodium. Foods with 20% or higher are considered high in sodium. Do not choose foods with salt as one of the first three ingredients on the ingredients list. If salt is one of the first three ingredients, it usually means the item is high in sodium.  Shopping Buy sodium-free or low-sodium products. Look for the following words on food labels: Low-sodium. Sodium-free. Reduced-sodium. No salt added. Unsalted. Always check the sodium content even if foods are labeled as low-sodium or no salt added. Buy fresh foods. Cooking Use herbs, seasonings without salt, and spices as substitutes for salt. Use sodium-free baking soda when baking. Grill, braise, or roast foods to add flavor with less salt. Avoid adding salt to pasta, rice, or hot cereals. Drain and rinse canned vegetables, beans, and meat before use. Avoid adding salt when cooking sweets and desserts. Cook with  low-sodium ingredients. What foods are high in sodium? Vegetables Regular canned vegetables (not low-sodium or reduced-sodium). Sauerkraut, pickled vegetables, and relishes. Olives. French fries. Onion rings. Regular canned tomato sauce and paste. Regular tomato and vegetable juice. Frozenvegetables in sauces. Grains Instant hot cereals. Bread stuffing, pancake, and biscuit mixes. Croutons. Seasoned rice or pasta mixes. Noodle soup cups. Boxed or frozen macaroni and cheese. Regular salted crackers. Self-rising flour. Rolls. Bagels. Flourtortillas and wraps. Meats and other proteins Meat or fish that is salted, canned, smoked, cured, spiced, or pickled. This includes bacon, ham, sausages, hot dogs, corned beef, chipped beef, meat loaves, salt pork, jerky, pickled herring, anchovies, regular canned tuna, andsardines. Salted nuts. Dairy Processed cheese and cheese spreads. Cheese curds. Blue cheese. Feta cheese.String cheese. Regular cottage cheese. Buttermilk. Canned milk. The items listed above may not be a complete list of foods high in sodium. Actual amounts of sodium may be different depending on processing. Contact a dietitian for more information. What foods are low in sodium? Fruits Fresh, frozen, or canned fruit with no sauce added. Fruit juice. Vegetables Fresh or frozen vegetables with no sauce added. "No salt added" canned vegetables. "No salt added" tomato sauce and paste. Low-sodium orreduced-sodium tomato and vegetable juice. Grains Noodles, pasta, quinoa, rice. Shredded or puffed wheat or puffed rice. Regular or quick oats (not instant). Low-sodium crackers. Low-sodium bread. Whole-grainbread and whole-grain pasta. Unsalted popcorn. Meats and other proteins Fresh or frozen whole meats, poultry (not injected with sodium), and fish with no sauce added. Unsalted nuts. Dried peas, beans, and   lentils without added salt. Unsalted canned beans. Eggs. Unsalted nut butters. Low-sodium canned  tunaor chicken. Dairy Milk. Soy milk. Yogurt. Low-sodium cheeses, such as Swiss, Monterey Jack, mozzarella, and ricotta. Sherbet or ice cream (keep to  cup per serving).Cream cheese. Fats and oils Unsalted butter or margarine. Other foods Homemade pudding. Sodium-free baking soda and baking powder. Herbs and spices.Low-sodium seasoning mixes. Beverages Coffee and tea. Carbonated beverages. The items listed above may not be a complete list of foods low in sodium. Actual amounts of sodium may be different depending on processing. Contact a dietitian for more information. What are some salt alternatives when cooking? The following are herbs, seasonings, and spices that can be used instead of salt to flavor your food. Herbs should be fresh or dried. Do not choose packaged mixes. Next to the name of the herb, spice, or seasoning aresome examples of foods you can pair it with. Herbs [place] leaves - Soups, meat and vegetable dishes, and spaghetti sauce. Basil - Italian dishes, soups, pasta, and fish dishes. Cilantro - Meat, poultry, and vegetable dishes. Chili powder - Marinades and Mexican dishes. Chives - Salad dressings and potato dishes. Cumin - Mexican dishes, couscous, and meat dishes. Dill - Fish dishes, sauces, and salads. Fennel - Meat and vegetable dishes, breads, and cookies. Garlic (do not use garlic salt) - Italian dishes, meat dishes, salad dressings, and sauces. Marjoram - Soups, potato dishes, and meat dishes. Oregano - Pizza and spaghetti sauce. Parsley - Salads, soups, pasta, and meat dishes. Rosemary - Italian dishes, salad dressings, soups, and red meats. Saffron - Fish dishes, pasta, and some poultry dishes. Sage - Stuffings and sauces. Tarragon - Fish and poultry dishes. Thyme - Stuffing, meat, and fish dishes. Seasonings Lemon juice - Fish dishes, poultry dishes, vegetables, and salads. Vinegar - Salad dressings, vegetables, and fish dishes. Spices Cinnamon - Sweet  dishes, such as cakes, cookies, and puddings. Cloves - Gingerbread, puddings, and marinades for meats. Curry - Vegetable dishes, fish and poultry dishes, and stir-fry dishes. Ginger - Vegetable dishes, fish dishes, and stir-fry dishes. Nutmeg - Pasta, vegetables, poultry, fish dishes, and custard. Summary Cooking with less salt is one way to reduce the amount of sodium that you get from food. Buy sodium-free or low-sodium products. Check the food label before using or buying packaged ingredients. Use herbs, seasonings without salt, and spices as substitutes for salt in foods. This information is not intended to replace advice given to you by your health care provider. Make sure you discuss any questions you have with your healthcare provider. Document Revised: 12/18/2018 Document Reviewed: 12/18/2018 Elsevier Patient Education  2022 [employer]  

## 2020-11-02 NOTE — Progress Notes (Signed)
I,Katawbba Wiggins,acting as a Neurosurgeon for Gwynneth Aliment, MD.,have documented all relevant documentation on the behalf of Gwynneth Aliment, MD,as directed by  Gwynneth Aliment, MD while in the presence of Gwynneth Aliment, MD.  This visit occurred during the SARS-CoV-2 public health emergency.  Safety protocols were in place, including screening questions prior to the visit, additional usage of staff PPE, and extensive cleaning of exam room while observing appropriate contact time as indicated for disinfecting solutions.  Subjective:     Patient ID: Jerome Pacheco , male    DOB: 1955/10/17 , 65 y.o.   MRN: [id number]   Chief Complaint  Patient presents with   Hypertension   Prediabetes    HPI  He is here today for bp and prediabetes f/u.  He reports compliance with  meds. He denies headaches, chest pain and shortness of breath.   Hypertension This is a chronic problem. The current episode started more than 1 year ago. The problem has been gradually improving since onset. The problem is controlled. Risk factors for coronary artery disease include obesity and stress. The current treatment provides moderate improvement. There are no compliance problems.     Past Medical History:  Diagnosis Date   Hypertension    Sleep apnea      Family History  Problem Relation Age of Onset   Heart disease Father    Cancer Mother      Current Outpatient Medications:    allopurinol (ZYLOPRIM) 100 MG tablet, Take 1 tablet (100 mg total) by mouth daily., Disp: 30 tablet, Rfl: 2   carvedilol (COREG) 12.5 MG tablet, Take 1 tablet (12.5 mg total) by mouth 2 (two) times daily with a meal., Disp: 180 tablet, Rfl: 1   HYDROcodone-acetaminophen (NORCO/VICODIN) 5-325 MG tablet, Take 1 tablet by mouth every 4 (four) hours as needed., Disp: 10 tablet, Rfl: 0   ibuprofen (ADVIL) 600 MG tablet, Take 1 tablet (600 mg total) by mouth every 6 (six) hours as needed., Disp: 30 tablet, Rfl: 0   predniSONE (STERAPRED  UNI-PAK 21 TAB) 10 MG (21) TBPK tablet, Take by mouth daily. Take 6 tabs by mouth daily  for 2 days, then 5 tabs for 2 days, then 4 tabs for 2 days, then 3 tabs for 2 days, 2 tabs for 2 days, then 1 tab by mouth daily for 2 days, Disp: 42 tablet, Rfl: 0   spironolactone (ALDACTONE) 50 MG tablet, Take 1 tablet by mouth once daily, Disp: 90 tablet, Rfl: 0   colchicine 0.6 MG tablet, Take 1 tablet (0.6 mg total) by mouth daily. (Patient not taking: Reported on 11/02/2020), Disp: 30 tablet, Rfl: 1   cyclobenzaprine (FLEXERIL) 5 MG tablet, Take 1 tablet (5 mg total) by mouth 3 (three) times daily as needed for muscle spasms. (Patient not taking: Reported on 11/02/2020), Disp: 30 tablet, Rfl: 0   Olmesartan-amLODIPine-HCTZ 40-10-25 MG TABS, TAKE (1) TABLET BY MOUTH EACH MORNING., Disp: 90 tablet, Rfl: 0   SAXENDA 18 MG/3ML SOPN, INJECT 3 MG SUBCUTANEOUSLY ONCE DAILY, Disp: 15 mL, Rfl: 1   No Known Allergies   Review of Systems  Constitutional: Negative.   Respiratory: Negative.    Cardiovascular: Negative.   Gastrointestinal: Negative.   Musculoskeletal:  Positive for arthralgias.       He presented to the ED t10/22 with right elbow pain.  Pt said he was having pain since awakening on Thursday, 10/20.  Pt said he has a hx of gout, but this feels different.  He has pain throughout his arm, but feels like it originates in his elbow.  He has a lot of pain with movement of his elbow and a lot of pain to touch.  He denies fall/trauma.   Psychiatric/Behavioral: Negative.    All other systems reviewed and are negative.   Today's Vitals   11/02/20 0858  BP: 124/78  Pulse: 95  Temp: 98.3 F (36.8 C)  Weight: 218 lb 3.2 oz (99 kg)  Height: 5\' 5"  (1.651 m)  PainSc: 4   PainLoc: Elbow   Body mass index is 36.31 kg/m.  Wt Readings from Last 3 Encounters:  11/02/20 218 lb 3.2 oz (99 kg)  08/24/20 216 lb 12.8 oz (98.3 kg)  08/16/20 220 lb 3.2 oz (99.9 kg)    BP Readings from Last 3 Encounters:   11/02/20 124/78  10/30/20 109/83  08/24/20 132/84    Objective:  Physical Exam Vitals and nursing note reviewed.  Constitutional:      Appearance: Normal appearance.  HENT:     Head: Normocephalic and atraumatic.     Nose:     Comments: Masked     Mouth/Throat:     Comments: Masked  Cardiovascular:     Rate and Rhythm: Normal rate and regular rhythm.     Heart sounds: Murmur heard.  Pulmonary:     Effort: Pulmonary effort is normal.     Breath sounds: Normal breath sounds.  Musculoskeletal:        General: Swelling and tenderness present.     Cervical back: Normal range of motion.  Skin:    General: Skin is warm.  Neurological:     General: No focal deficit present.     Mental Status: He is alert.  Psychiatric:        Mood and Affect: Mood normal.        Assessment And Plan:     1. Essential hypertension, malignant Comments: Chronic, controlled. He is encouraged to follow low sodium diet. No med changes. I will check renal function today.  He will f/u in 4-6 months.   2. Bursitis of right elbow, unspecified bursa Comments: ED records reviewed. He is advised to apply topical Voltaren gel to affected area BID-TID prn. I will refer him to Ortho should his sx persist. I will also check uric acid level.  - Uric acid  3. Other abnormal glucose Comments: His a1c has been elevated in the past. I will recheck this today. He is encouraged to llimit intake of sweetened beverages, including diet drinks.  - Hemoglobin A1c  4. Class 2 severe obesity due to excess calories with serious comorbidity and body mass index (BMI) of 36.0 to 36.9 in adult [facility]) Comments:  He is encouraged to initially strive for BMI less than 30 to decrease cardiac risk. He is advised to exercise no less than 150 minutes per week.    5. Immunization due Comments: He was given the high dose flu vaccine. - Flu Vaccine QUAD 6+ mos PF IM (Fluarix Quad PF)  Patient was given opportunity to ask questions.  Patient verbalized understanding of the plan and was able to repeat key elements of the plan. All questions were answered to their satisfaction.   I, [facility], MD, have reviewed all documentation for this visit. The documentation on 11/02/20 for the exam, diagnosis, procedures, and orders are all accurate and complete.   IF YOU HAVE BEEN REFERRED TO A SPECIALIST, IT MAY TAKE 1-2 WEEKS TO SCHEDULE/PROCESS THE  REFERRAL. IF YOU HAVE NOT HEARD FROM US/SPECIALIST IN TWO WEEKS, PLEASE GIVE [place] A CALL AT [phone].   THE PATIENT IS ENCOURAGED TO PRACTICE SOCIAL DISTANCING DUE TO THE COVID-19 PANDEMIC.

## 2020-11-03 LAB — HEMOGLOBIN A1C
Est. average glucose Bld gHb Est-mCnc: 123 mg/dL
Hgb A1c MFr Bld: 5.9 % — ABNORMAL HIGH (ref 4.8–5.6)

## 2020-11-03 LAB — URIC ACID: Uric Acid: 4.6 mg/dL (ref 3.8–8.4)

## 2020-11-16 ENCOUNTER — Other Ambulatory Visit: Payer: Self-pay | Admitting: Internal Medicine

## 2020-11-24 ENCOUNTER — Other Ambulatory Visit: Payer: Self-pay | Admitting: Internal Medicine

## 2020-11-26 ENCOUNTER — Ambulatory Visit: Attending: Family

## 2020-11-26 DIAGNOSIS — Z23 Encounter for immunization: Secondary | ICD-10-CM

## 2020-11-26 NOTE — Progress Notes (Signed)
   Covid-19 Vaccination Clinic  Name:  Jerome Pacheco    MRN: [id number] DOB: 12-Sep-1955  11/26/2020  [name] was observed post Covid-19 immunization for 15 minutes without incident. He was provided with Vaccine Information Sheet and instruction to access the V-Safe system.   [name] was instructed to call 911 with any severe reactions post vaccine: Difficulty breathing  Swelling of face and throat  A fast heartbeat  A bad rash all over body  Dizziness and weakness   Immunizations Administered     Name Date Dose VIS Date Route   Moderna Covid-19 vaccine Bivalent Booster 11/26/2020  9:00 AM 0.5 mL 08/21/2020 Intramuscular   Manufacturer: Moderna   Lot: [id number]   NDC: 76811-572-62

## 2020-12-06 DIAGNOSIS — E66812 Obesity, class 2: Secondary | ICD-10-CM | POA: Insufficient documentation

## 2020-12-06 DIAGNOSIS — R7309 Other abnormal glucose: Secondary | ICD-10-CM | POA: Insufficient documentation

## 2020-12-06 DIAGNOSIS — M7031 Other bursitis of elbow, right elbow: Secondary | ICD-10-CM | POA: Insufficient documentation

## 2020-12-17 ENCOUNTER — Other Ambulatory Visit: Payer: Self-pay | Admitting: Internal Medicine

## 2021-01-12 ENCOUNTER — Other Ambulatory Visit: Payer: Self-pay | Admitting: Internal Medicine

## 2021-01-21 ENCOUNTER — Other Ambulatory Visit: Payer: Self-pay | Admitting: Internal Medicine

## 2021-02-08 ENCOUNTER — Ambulatory Visit (INDEPENDENT_AMBULATORY_CARE_PROVIDER_SITE_OTHER): Payer: BC Managed Care – PPO | Admitting: Internal Medicine

## 2021-02-08 ENCOUNTER — Other Ambulatory Visit: Payer: Self-pay

## 2021-02-08 ENCOUNTER — Encounter: Payer: Self-pay | Admitting: Internal Medicine

## 2021-02-08 VITALS — BP 122/70 | HR 79 | Temp 98.2°F | Ht 65.0 in | Wt 221.2 lb

## 2021-02-08 DIAGNOSIS — Z23 Encounter for immunization: Secondary | ICD-10-CM

## 2021-02-08 DIAGNOSIS — I10 Essential (primary) hypertension: Secondary | ICD-10-CM

## 2021-02-08 DIAGNOSIS — M1A9XX0 Chronic gout, unspecified, without tophus (tophi): Secondary | ICD-10-CM

## 2021-02-08 DIAGNOSIS — R7303 Prediabetes: Secondary | ICD-10-CM

## 2021-02-08 DIAGNOSIS — Z6836 Body mass index (BMI) 36.0-36.9, adult: Secondary | ICD-10-CM

## 2021-02-08 MED ORDER — ALLOPURINOL 100 MG PO TABS: 100.0000 mg | ORAL_TABLET | Freq: Every day | ORAL | 2 refills | Status: DC

## 2021-02-08 MED ORDER — CARVEDILOL 12.5 MG PO TABS: 12.5000 mg | ORAL_TABLET | Freq: Two times a day (BID) | ORAL | 1 refills | Status: DC

## 2021-02-08 MED ORDER — OLMESARTAN-AMLODIPINE-HCTZ 40-10-25 MG PO TABS: ORAL_TABLET | ORAL | 2 refills | Status: DC

## 2021-02-08 NOTE — Progress Notes (Signed)
Rich Brave Llittleton,acting as a Education administrator for Maximino Greenland, MD.,have documented all relevant documentation on the behalf of Maximino Greenland, MD,as directed by  Maximino Greenland, MD while in the presence of Maximino Greenland, MD.  This visit occurred during the SARS-CoV-2 public health emergency.  Safety protocols were in place, including screening questions prior to the visit, additional usage of staff PPE, and extensive cleaning of exam room while observing appropriate contact time as indicated for disinfecting solutions.  Subjective:     Patient ID: Jerome Pacheco , male    DOB: Jan 16, 1955 , 66 y.o.   MRN: [id number]   Chief Complaint  Patient presents with   Prediabetes    HPI  Patient presents today for a prediabetes f/u. He is complaint with his meds and has no questions or concerns at this time.    Hypertension This is a chronic problem. The current episode started more than 1 year ago. The problem has been gradually improving since onset. The problem is controlled. Risk factors for coronary artery disease include obesity and stress. The current treatment provides moderate improvement. There are no compliance problems.     Past Medical History:  Diagnosis Date   Hypertension    Sleep apnea      Family History  Problem Relation Age of Onset   Heart disease Father    Cancer Mother      Current Outpatient Medications:    colchicine 0.6 MG tablet, Take 1 tablet (0.6 mg total) by mouth daily. (Patient taking differently: Take 0.6 mg by mouth daily as needed.), Disp: 30 tablet, Rfl: 1   cyclobenzaprine (FLEXERIL) 5 MG tablet, Take 1 tablet (5 mg total) by mouth 3 (three) times daily as needed for muscle spasms., Disp: 30 tablet, Rfl: 0   HYDROcodone-acetaminophen (NORCO/VICODIN) 5-325 MG tablet, Take 1 tablet by mouth every 4 (four) hours as needed., Disp: 10 tablet, Rfl: 0   ibuprofen (ADVIL) 600 MG tablet, Take 1 tablet (600 mg total) by mouth every 6 (six) hours as needed.,  Disp: 30 tablet, Rfl: 0   spironolactone (ALDACTONE) 50 MG tablet, Take 1 tablet by mouth once daily, Disp: 90 tablet, Rfl: 1   allopurinol (ZYLOPRIM) 100 MG tablet, Take 1 tablet (100 mg total) by mouth daily., Disp: 90 tablet, Rfl: 2   carvedilol (COREG) 12.5 MG tablet, Take 1 tablet (12.5 mg total) by mouth 2 (two) times daily with a meal., Disp: 180 tablet, Rfl: 1   Olmesartan-amLODIPine-HCTZ 40-10-25 MG TABS, TAKE (1) TABLET BY MOUTH EACH MORNING., Disp: 90 tablet, Rfl: 2   No Known Allergies   Review of Systems  Constitutional: Negative.   Respiratory: Negative.    Cardiovascular: Negative.   Gastrointestinal: Negative.   Neurological: Negative.   Psychiatric/Behavioral: Negative.      Today's Vitals   02/08/21 1046  BP: 122/70  Pulse: 79  Temp: 98.2 F (36.8 C)  Weight: 221 lb 3.2 oz (100.3 kg)  Height: _0  (1.651 m)  PainSc: 0-No pain   Body mass index is 36.81 kg/m.  Wt Readings from Last 3 Encounters:  02/08/21 221 lb 3.2 oz (100.3 kg)  11/02/20 218 lb 3.2 oz (99 kg)  08/24/20 216 lb 12.8 oz (98.3 kg)     Objective:  Physical Exam Vitals and nursing note reviewed.  Constitutional:      Appearance: Normal appearance.  HENT:     Head: Normocephalic and atraumatic.     Nose:     Comments: Masked  Mouth/Throat:     Comments: Masked  Eyes:     Extraocular Movements: Extraocular movements intact.  Cardiovascular:     Rate and Rhythm: Normal rate and regular rhythm.     Heart sounds: Normal heart sounds.  Pulmonary:     Effort: Pulmonary effort is normal.     Breath sounds: Normal breath sounds.  Musculoskeletal:     Cervical back: Normal range of motion.  Skin:    General: Skin is warm.  Neurological:     General: No focal deficit present.     Mental Status: He is alert.  Psychiatric:        Mood and Affect: Mood normal.        Assessment And Plan:     1. Prediabetes Comments: Chronic, I will check labs as below. He is encouraged to void  refined carbs and sugary beverages, including diet drinks.  - Hemoglobin A1c  2. Essential hypertension, malignant Comments: Chronic, well controlled. No med changes. He is encouraged to follow low sodium diet. He will rto in 4-6 months for re-evaluation.  - CMP14+EGFR  3. Chronic gout involving toe of left foot without tophus, unspecified cause Comments: Chronic, I will check uric acid levels today. He is encouraged to avoid known triggers and to stay well hydrated.  - Uric acid  4. Class 2 severe obesity due to excess calories with serious comorbidity and body mass index (BMI) of 36.0 to 36.9 in adult [facility]) Comments: He is up 5 lbs since August 2022. He is encouraged to aim for at least 150 minutes of exercise per week.   5. Immunization due Comments: He was given second Shingrix IM x 1 today.  - Varicella-zoster vaccine IM (Shingrix)   Patient was given opportunity to ask questions. Patient verbalized understanding of the plan and was able to repeat key elements of the plan. All questions were answered to their satisfaction.   I, Maximino Greenland, MD, have reviewed all documentation for this visit. The documentation on 02/08/21 for the exam, diagnosis, procedures, and orders are all accurate and complete.   IF YOU HAVE BEEN REFERRED TO A SPECIALIST, IT MAY TAKE 1-2 WEEKS TO SCHEDULE/PROCESS THE REFERRAL. IF YOU HAVE NOT HEARD FROM US/SPECIALIST IN TWO WEEKS, PLEASE GIVE [place] A CALL AT [phone].   THE PATIENT IS ENCOURAGED TO PRACTICE SOCIAL DISTANCING DUE TO THE COVID-19 PANDEMIC.

## 2021-02-08 NOTE — Patient Instructions (Signed)
Gout ?Gout is a condition that causes painful swelling of the joints. Gout is a type of inflammation of the joints (arthritis). This condition is caused by having too much uric acid in the body. Uric acid is a chemical that forms when the body breaks down substances called purines. Purines are important for building body proteins. ?When the body has too much uric acid, sharp crystals can form and build up inside the joints. This causes pain and swelling. Gout attacks can happen quickly and may be very painful (acute gout). Over time, the attacks can affect more joints and become more frequent (chronic gout). Gout can also cause uric acid to build up under the skin and inside the kidneys. ?What are the causes? ?This condition is caused by too much uric acid in your blood. This can happen because: ?Your kidneys do not remove enough uric acid from your blood. This is the most common cause. ?Your body makes too much uric acid. This can happen with some cancers and cancer treatments. It can also occur if your body is breaking down too many red blood cells (hemolytic anemia). ?You eat too many foods that are high in purines. These foods include organ meats and some seafood. Alcohol, especially beer, is also high in purines. ?A gout attack may be triggered by trauma or stress. ?What increases the risk? ?You are more likely to develop this condition if you: ?Have a family history of gout. ?Are male and middle-aged. ?Are male and have gone through menopause. ?Are obese. ?Frequently drink alcohol, especially beer. ?Are dehydrated. ?Lose weight too quickly. ?Have an organ transplant. ?Have lead poisoning. ?Take certain medicines, including aspirin, cyclosporine, diuretics, levodopa, and niacin. ?Have kidney disease. ?Have a skin condition called psoriasis. ?What are the signs or symptoms? ?An attack of acute gout happens quickly. It usually occurs in just one joint. The most common place is the big toe. Attacks often start  at night. Other joints that may be affected include joints of the feet, ankle, knee, fingers, wrist, or elbow. Symptoms of this condition may include: ?Severe pain. ?Warmth. ?Swelling. ?Stiffness. ?Tenderness. The affected joint may be very painful to touch. ?Shiny, red, or purple skin. ?Chills and fever. ?Chronic gout may cause symptoms more frequently. More joints may be involved. You may also have white or yellow lumps (tophi) on your hands or feet or in other areas near your joints. ?How is this diagnosed? ?This condition is diagnosed based on your symptoms, medical history, and physical exam. You may have tests, such as: ?Blood tests to measure uric acid levels. ?Removal of joint fluid with a thin needle (aspiration) to look for uric acid crystals. ?X-rays to look for joint damage. ?How is this treated? ?Treatment for this condition has two phases: treating an acute attack and preventing future attacks. Acute gout treatment may include medicines to reduce pain and swelling, including: ?NSAIDs. ?Steroids. These are strong anti-inflammatory medicines that can be taken by mouth (orally) or injected into a joint. ?Colchicine. This medicine relieves pain and swelling when it is taken soon after an attack. It can be given by mouth or through an IV. ?Preventive treatment may include: ?Daily use of smaller doses of NSAIDs or colchicine. ?Use of a medicine that reduces uric acid levels in your blood. ?Changes to your diet. You may need to see a dietitian about what to eat and drink to prevent gout. ?Follow these instructions at home: ?During a gout attack ? ?If directed, put ice on the affected area: ?  Put ice in a plastic bag. ?Place a towel between your skin and the bag. ?Leave the ice on for 20 minutes, 2-3 times a day. ?Raise (elevate) the affected joint above the level of your heart as often as possible. ?Rest the joint as much as possible. If the affected joint is in your leg, you may be given crutches to  use. ?Follow instructions from your health care provider about eating or drinking restrictions. ?Avoiding future gout attacks ?Follow a low-purine diet as told by your dietitian or health care provider. Avoid foods and drinks that are high in purines, including liver, kidney, anchovies, asparagus, herring, mushrooms, mussels, and beer. ?Maintain a healthy weight or lose weight if you are overweight. If you want to lose weight, talk with your health care provider. It is important that you do not lose weight too quickly. ?Start or maintain an exercise program as told by your health care provider. ?Eating and drinking ?Drink enough fluids to keep your urine pale yellow. ?If you drink alcohol: ?Limit how much you use to: ?0-1 drink a day for women. ?0-2 drinks a day for men. ?Be aware of how much alcohol is in your drink. In the [place] one drink equals one 12 oz bottle of beer (355 mL) one 5 oz glass of wine (148 mL), or one 1? oz glass of hard liquor (44 mL). ?General instructions ?Take over-the-counter and prescription medicines only as told by your health care provider. ?Do not drive or use heavy machinery while taking prescription pain medicine. ?Return to your normal activities as told by your health care provider. Ask your health care provider what activities are safe for you. ?Keep all follow-up visits as told by your health care provider. This is important. ?Contact a health care provider if you have: ?Another gout attack. ?Continuing symptoms of a gout attack after 10 days of treatment. ?Side effects from your medicines. ?Chills or a fever. ?Burning pain when you urinate. ?Pain in your lower back or belly. ?Get help right away if you: ?Have severe or uncontrolled pain. ?Cannot urinate. ?Summary ?Gout is painful swelling of the joints caused by inflammation. ?The most common site of pain is the big toe, but it can affect other joints in the body. ?Medicines and dietary changes can help to prevent and treat gout  attacks. ?This information is not intended to replace advice given to you by your health care provider. Make sure you discuss any questions you have with your health care provider. ?Document Revised: 07/06/2017 Document Reviewed: 07/18/2017 ?Elsevier Patient Education ? 2022 [employer] ? ?

## 2021-02-09 LAB — CMP14+EGFR
ALT: 41 IU/L (ref 0–44)
AST: 32 IU/L (ref 0–40)
Albumin/Globulin Ratio: 2.2 (ref 1.2–2.2)
Albumin: 4.7 g/dL (ref 3.8–4.8)
Alkaline Phosphatase: 72 IU/L (ref 44–121)
BUN/Creatinine Ratio: 15 (ref 10–24)
BUN: 20 mg/dL (ref 8–27)
Bilirubin Total: 1 mg/dL (ref 0.0–1.2)
CO2: 24 mmol/L (ref 20–29)
Calcium: 9.6 mg/dL (ref 8.6–10.2)
Chloride: 103 mmol/L (ref 96–106)
Creatinine, Ser: 1.3 mg/dL — ABNORMAL HIGH (ref 0.76–1.27)
Globulin, Total: 2.1 g/dL (ref 1.5–4.5)
Glucose: 109 mg/dL — ABNORMAL HIGH (ref 70–99)
Potassium: 3.2 mmol/L — ABNORMAL LOW (ref 3.5–5.2)
Sodium: 142 mmol/L (ref 134–144)
Total Protein: 6.8 g/dL (ref 6.0–8.5)
eGFR: 61 mL/min/{1.73_m2} (ref >59)

## 2021-02-09 LAB — HEMOGLOBIN A1C
Est. average glucose Bld gHb Est-mCnc: 134 mg/dL
Hgb A1c MFr Bld: 6.3 % — ABNORMAL HIGH (ref 4.8–5.6)

## 2021-02-09 LAB — URIC ACID: Uric Acid: 6.8 mg/dL (ref 3.8–8.4)

## 2021-03-03 ENCOUNTER — Other Ambulatory Visit: Payer: Self-pay | Admitting: Internal Medicine

## 2021-04-11 ENCOUNTER — Encounter: Payer: Self-pay | Admitting: Internal Medicine

## 2021-04-11 ENCOUNTER — Ambulatory Visit (INDEPENDENT_AMBULATORY_CARE_PROVIDER_SITE_OTHER): Payer: BC Managed Care – PPO | Admitting: Internal Medicine

## 2021-04-11 VITALS — BP 126/80 | HR 76 | Temp 98.3°F | Ht 65.0 in | Wt 219.8 lb

## 2021-04-11 DIAGNOSIS — R7303 Prediabetes: Secondary | ICD-10-CM | POA: Diagnosis not present

## 2021-04-11 DIAGNOSIS — I10 Essential (primary) hypertension: Secondary | ICD-10-CM | POA: Diagnosis not present

## 2021-04-11 DIAGNOSIS — Z1211 Encounter for screening for malignant neoplasm of colon: Secondary | ICD-10-CM

## 2021-04-11 DIAGNOSIS — Z23 Encounter for immunization: Secondary | ICD-10-CM

## 2021-04-11 DIAGNOSIS — Z6836 Body mass index (BMI) 36.0-36.9, adult: Secondary | ICD-10-CM

## 2021-04-11 DIAGNOSIS — Z0001 Encounter for general adult medical examination with abnormal findings: Secondary | ICD-10-CM

## 2021-04-11 DIAGNOSIS — Z86010 Personal history of colonic polyps: Secondary | ICD-10-CM

## 2021-04-11 LAB — POCT URINALYSIS DIPSTICK
Bilirubin, UA: NEGATIVE
Blood, UA: NEGATIVE
Glucose, UA: NEGATIVE
Ketones, UA: NEGATIVE
Leukocytes, UA: NEGATIVE
Nitrite, UA: NEGATIVE
Protein, UA: NEGATIVE
Spec Grav, UA: 1.02 (ref 1.010–1.025)
Urobilinogen, UA: 2 E.U./dL — AB
pH, UA: 6.5 (ref 5.0–8.0)

## 2021-04-11 LAB — POC HEMOCCULT BLD/STL (OFFICE/1-CARD/DIAGNOSTIC): Fecal Occult Blood, POC: NEGATIVE

## 2021-04-11 NOTE — Patient Instructions (Signed)

## 2021-04-11 NOTE — Progress Notes (Signed)
?Rich Brave Llittleton,acting as a Education administrator for Maximino Greenland, MD.,have documented all relevant documentation on the behalf of Maximino Greenland, MD,as directed by  Maximino Greenland, MD while in the presence of Maximino Greenland, MD.  ?This visit occurred during the SARS-CoV-2 public health emergency.  Safety protocols were in place, including screening questions prior to the visit, additional usage of staff PPE, and extensive cleaning of exam room while observing appropriate contact time as indicated for disinfecting solutions. ? ?Subjective:  ?  ? Patient ID: Jerome Pacheco , male    DOB: 07-10-1955 , 66 y.o.   MRN: [id number] ? ? ?Chief Complaint  ?Patient presents with  ? Annual Exam  ? Hypertension  ? ? ?HPI ? ?He is here today for a full physical examination.  He has no specific concerns at this time . He reports compliance with meds. He denies headaches, chest pain and shortness of breath. He plans to retire in December 2023.  ? ?Hypertension ?This is a chronic problem. The current episode started more than 1 year ago. The problem has been gradually improving since onset. The problem is controlled. Pertinent negatives include no blurred vision, chest pain, palpitations or shortness of breath. Anxiety: ekg.Risk factors for coronary artery disease include male gender and obesity.   ? ?Past Medical History:  ?Diagnosis Date  ? Hypertension   ? Sleep apnea   ?  ? ?Family History  ?Problem Relation Age of Onset  ? Heart disease Father   ? Cancer Mother   ? ? ? ?Current Outpatient Medications:  ?  allopurinol (ZYLOPRIM) 100 MG tablet, Take 1 tablet (100 mg total) by mouth daily., Disp: 90 tablet, Rfl: 2 ?  carvedilol (COREG) 12.5 MG tablet, Take 1 tablet (12.5 mg total) by mouth 2 (two) times daily with a meal., Disp: 180 tablet, Rfl: 1 ?  colchicine 0.6 MG tablet, Take 1 tablet (0.6 mg total) by mouth daily. (Patient taking differently: Take 0.6 mg by mouth daily as needed.), Disp: 30 tablet, Rfl: 1 ?  cyclobenzaprine  (FLEXERIL) 5 MG tablet, Take 1 tablet (5 mg total) by mouth 3 (three) times daily as needed for muscle spasms., Disp: 30 tablet, Rfl: 0 ?  HYDROcodone-acetaminophen (NORCO/VICODIN) 5-325 MG tablet, Take 1 tablet by mouth every 4 (four) hours as needed., Disp: 10 tablet, Rfl: 0 ?  ibuprofen (ADVIL) 600 MG tablet, Take 1 tablet (600 mg total) by mouth every 6 (six) hours as needed., Disp: 30 tablet, Rfl: 0 ?  Olmesartan-amLODIPine-HCTZ 40-10-25 MG TABS, TAKE (1) TABLET BY MOUTH EACH MORNING., Disp: 90 tablet, Rfl: 0 ?  spironolactone (ALDACTONE) 50 MG tablet, Take 1 tablet by mouth once daily, Disp: 90 tablet, Rfl: 1  ? ?No Known Allergies  ? ?Men's preventive visit. Patient Health Questionnaire (PHQ-2) is  ?[facility] Office Visit from 04/11/2021 in [facility]  ?PHQ-2 Total Score 0  ? ?  ?. Patient is on a low sodium diet. Marital status: Married. Relevant history for alcohol use is:  ?Social History  ? ?Substance and Sexual Activity  ?Alcohol Use No  ? Comment: quit 2012  ?Marland Kitchen Relevant history for tobacco use is:  ?Social History  ? ?Tobacco Use  ?Smoking Status Never  ?Smokeless Tobacco Never  ?.  ? ?Review of Systems  ?Constitutional: Negative.   ?HENT: Negative.    ?Eyes: Negative.  Negative for blurred vision.  ?Respiratory: Negative.  Negative for shortness of breath.   ?Cardiovascular: Negative.  Negative for chest  pain and palpitations.  ?Gastrointestinal: Negative.   ?Endocrine: Negative.   ?Genitourinary: Negative.   ?Musculoskeletal: Negative.   ?Skin: Negative.   ?Neurological: Negative.   ?Hematological: Negative.   ?Psychiatric/Behavioral: Negative.     ? ?Today's Vitals  ? 04/11/21 0841  ?BP: 126/80  ?Pulse: 76  ?Temp: 98.3 ?F (36.8 ?C)  ?Weight: 219 lb 12.8 oz (99.7 kg)  ?Height: 5' 5"  (1.651 m)  ?PainSc: 0-No pain  ? ?Body mass index is 36.58 kg/m?.  ?Wt Readings from Last 3 Encounters:  ?04/11/21 219 lb 12.8 oz (99.7 kg)  ?02/08/21 221 lb 3.2 oz (100.3 kg)  ?11/02/20 218 lb  3.2 oz (99 kg)  ?  ? ?Objective:  ?Physical Exam ?Vitals and nursing note reviewed. Exam conducted with a chaperone present.  ?Constitutional:   ?   Appearance: Normal appearance.  ?HENT:  ?   Head: Normocephalic and atraumatic.  ?   Right Ear: Tympanic membrane, ear canal and external ear normal.  ?   Left Ear: Tympanic membrane, ear canal and external ear normal.  ?   Nose: Nose normal.  ?   Mouth/Throat:  ?   Mouth: Mucous membranes are moist.  ?   Pharynx: Oropharynx is clear.  ?Eyes:  ?   Extraocular Movements: Extraocular movements intact.  ?   Conjunctiva/sclera: Conjunctivae normal.  ?   Pupils: Pupils are equal, round, and reactive to light.  ?Cardiovascular:  ?   Rate and Rhythm: Normal rate and regular rhythm.  ?   Pulses: Normal pulses.  ?   Heart sounds: Normal heart sounds.  ?Pulmonary:  ?   Effort: Pulmonary effort is normal.  ?   Breath sounds: Normal breath sounds.  ?Chest:  ?Breasts: ?   Right: Normal. No swelling, bleeding, inverted nipple, mass or nipple discharge.  ?   Left: Normal. No swelling, bleeding, inverted nipple, mass or nipple discharge.  ?Abdominal:  ?   General: Bowel sounds are normal.  ?   Palpations: Abdomen is soft.  ?   Comments: Rounded, soft  ?Genitourinary: ?   Prostate: Enlarged.  ?   Rectum: Normal. Guaiac result negative.  ?Musculoskeletal:     ?   General: Normal range of motion.  ?   Cervical back: Normal range of motion and neck supple.  ?Skin: ?   General: Skin is warm.  ?Neurological:  ?   General: No focal deficit present.  ?   Mental Status: He is alert.  ?Psychiatric:     ?   Mood and Affect: Mood normal.     ?   Behavior: Behavior normal.  ?   ?Assessment And Plan:  ?  ?1. Encounter for general adult medical examination with abnormal findings ?Comments: A full exam was performed. DRE performed, stool is heme negative. Prostate appears to be enlarged, no nodules appreciated. PATIENT IS ADVISED TO GET 30-45 MINUTES REGULAR EXERCISE NO LESS THAN FOUR TO FIVE DAYS PER  WEEK - BOTH WEIGHTBEARING EXERCISES AND AEROBIC ARE RECOMMENDED.  PATIENT IS ADVISED TO FOLLOW A HEALTHY DIET WITH AT LEAST SIX FRUITS/VEGGIES PER DAY, DECREASE INTAKE OF RED MEAT, AND TO INCREASE FISH INTAKE TO TWO DAYS PER WEEK.  MEATS/FISH SHOULD NOT BE FRIED, BAKED OR BROILED IS PREFERABLE.  IT IS ALSO IMPORTANT TO CUT BACK ON YOUR SUGAR INTAKE. PLEASE AVOID ANYTHING WITH ADDED SUGAR, CORN SYRUP OR OTHER SWEETENERS. IF YOU MUST USE A SWEETENER, YOU CAN TRY STEVIA. IT IS ALSO IMPORTANT TO AVOID ARTIFICIALLY SWEETENERS AND DIET BEVERAGES.  LASTLY, I SUGGEST WEARING SPF 50 SUNSCREEN ON EXPOSED PARTS AND ESPECIALLY WHEN IN THE DIRECT SUNLIGHT FOR AN EXTENDED PERIOD OF TIME.  PLEASE AVOID FAST FOOD RESTAURANTS AND INCREASE YOUR WATER INTAKE. ? ?- Lipid panel ?- CBC no Diff ?- Hemoglobin A1c ?- PSA ?- POC Hemoccult Bld/Stl (1-Cd Office Dx) ?- BMP8+eGFR ? ?2. Essential hypertension, malignant ?Comments: Chronic, well controlled.  EKG performed, NSR w/o acute changes. He will c/w Tribenzor, spironolactone and carvedilol. He will rto in 6 months, encouraged to  ?- POCT Urinalysis Dipstick (97471) ?- Microalbumin / Creatinine Urine Ratio ?- EKG 12-Lead ? ?3. Prediabetes ?Comments: Chronic, I will check an a1c. He is encouraged to limit his intake of sugary beverages and refined carbs, including desserts. ? ?4. Class 2 severe obesity due to excess calories with serious comorbidity and body mass index (BMI) of 36.0 to 36.9 in adult [facility]) ?Comments: He is encouraged to strive for BMI<30 to decrease cardiac risk. Advised to aim for at least 150 minutes of exercise per week.  ? ?5. Encounter for Hemoccult screening ?Comments: Stool is heme negative.  ? ?6. Personal history of colonic polyps ?Comments: I will refer him to GI for CRC screeming. He had a polyp in 2017. Last report states repeat due in 5-10 years.  ?- Ambulatory referral to Gastroenterology ? ?7. Immunization due ?Comments: He was given Prevnar-20 IM x1.  ?-  Pneumococcal conjugate vaccine 20-valent (Prevnar 20) ? ?Patient was given opportunity to ask questions. Patient verbalized understanding of the plan and was able to repeat key elements of the plan. All questi

## 2021-04-12 LAB — CBC
Hematocrit: 42.2 % (ref 37.5–51.0)
Hemoglobin: 14.3 g/dL (ref 13.0–17.7)
MCH: 29.5 pg (ref 26.6–33.0)
MCHC: 33.9 g/dL (ref 31.5–35.7)
MCV: 87 fL (ref 79–97)
Platelets: 249 10*3/uL (ref 150–450)
RBC: 4.84 x10E6/uL (ref 4.14–5.80)
RDW: 13.2 % (ref 11.6–15.4)
WBC: 4.4 10*3/uL (ref 3.4–10.8)

## 2021-04-12 LAB — MICROALBUMIN / CREATININE URINE RATIO
Creatinine, Urine: 155.5 mg/dL
Microalb/Creat Ratio: 7 mg/g creat (ref 0–29)
Microalbumin, Urine: 10.3 ug/mL

## 2021-04-12 LAB — BMP8+EGFR
BUN/Creatinine Ratio: 15 (ref 10–24)
BUN: 16 mg/dL (ref 8–27)
CO2: 25 mmol/L (ref 20–29)
Calcium: 10 mg/dL (ref 8.6–10.2)
Chloride: 102 mmol/L (ref 96–106)
Creatinine, Ser: 1.04 mg/dL (ref 0.76–1.27)
Glucose: 115 mg/dL — ABNORMAL HIGH (ref 70–99)
Potassium: 3.8 mmol/L (ref 3.5–5.2)
Sodium: 143 mmol/L (ref 134–144)
eGFR: 80 mL/min/{1.73_m2} (ref >59)

## 2021-04-12 LAB — HEMOGLOBIN A1C
Est. average glucose Bld gHb Est-mCnc: 123 mg/dL
Hgb A1c MFr Bld: 5.9 % — ABNORMAL HIGH (ref 4.8–5.6)

## 2021-04-12 LAB — LIPID PANEL
Chol/HDL Ratio: 3.6 ratio (ref 0.0–5.0)
Cholesterol, Total: 162 mg/dL (ref 100–199)
HDL: 45 mg/dL (ref >39)
LDL Chol Calc (NIH): 95 mg/dL (ref 0–99)
Triglycerides: 124 mg/dL (ref 0–149)
VLDL Cholesterol Cal: 22 mg/dL (ref 5–40)

## 2021-04-12 LAB — PSA: Prostate Specific Ag, Serum: 0.7 ng/mL (ref 0.0–4.0)

## 2021-06-29 ENCOUNTER — Other Ambulatory Visit: Payer: Self-pay | Admitting: Internal Medicine

## 2021-07-01 ENCOUNTER — Other Ambulatory Visit: Payer: Self-pay | Admitting: Internal Medicine

## 2021-08-29 ENCOUNTER — Encounter: Payer: Self-pay | Admitting: Internal Medicine

## 2021-08-29 ENCOUNTER — Ambulatory Visit: Payer: BC Managed Care – PPO | Admitting: Internal Medicine

## 2021-08-29 VITALS — BP 122/84 | Temp 98.1°F | Ht 65.0 in | Wt 216.2 lb

## 2021-08-29 DIAGNOSIS — Z86010 Personal history of colonic polyps: Secondary | ICD-10-CM

## 2021-08-29 DIAGNOSIS — I10 Essential (primary) hypertension: Secondary | ICD-10-CM | POA: Diagnosis not present

## 2021-08-29 DIAGNOSIS — Z1211 Encounter for screening for malignant neoplasm of colon: Secondary | ICD-10-CM

## 2021-08-29 DIAGNOSIS — Z6835 Body mass index (BMI) 35.0-35.9, adult: Secondary | ICD-10-CM

## 2021-08-29 DIAGNOSIS — R7303 Prediabetes: Secondary | ICD-10-CM | POA: Insufficient documentation

## 2021-08-29 DIAGNOSIS — M1A9XX0 Chronic gout, unspecified, without tophus (tophi): Secondary | ICD-10-CM | POA: Diagnosis not present

## 2021-08-29 DIAGNOSIS — Z23 Encounter for immunization: Secondary | ICD-10-CM

## 2021-08-29 NOTE — Progress Notes (Signed)
I,[name],acting as a scribe for Maximino Greenland, MD.,have documented all relevant documentation on the behalf of Maximino Greenland, MD,as directed by  Maximino Greenland, MD while in the presence of Maximino Greenland, MD.    Subjective:     Patient ID: Jerome Pacheco , male    DOB: [birth year] , 66 y.o.   MRN: [id number]   Chief Complaint  Patient presents with   Hypertension   Diabetes    HPI  Patient presents today for a prediabetes f/u & bpc. He is complaint with his meds and has no questions or concerns at this time.    Hypertension This is a chronic problem. The current episode started more than 1 year ago. The problem has been gradually improving since onset. The problem is controlled. Risk factors for coronary artery disease include obesity and stress. The current treatment provides moderate improvement. There are no compliance problems.      Past Medical History:  Diagnosis Date   Hypertension    Sleep apnea      Family History  Problem Relation Age of Onset   Heart disease Father    Cancer Mother      Current Outpatient Medications:    allopurinol (ZYLOPRIM) 100 MG tablet, Take 1 tablet (100 mg total) by mouth daily., Disp: 90 tablet, Rfl: 2   carvedilol (COREG) 12.5 MG tablet, Take 1 tablet (12.5 mg total) by mouth 2 (two) times daily with a meal., Disp: 180 tablet, Rfl: 1   ibuprofen (ADVIL) 600 MG tablet, Take 1 tablet (600 mg total) by mouth every 6 (six) hours as needed., Disp: 30 tablet, Rfl: 0   Olmesartan-amLODIPine-HCTZ 40-10-25 MG TABS, TAKE (1) TABLET BY MOUTH EACH MORNING., Disp: 90 tablet, Rfl: 0   spironolactone (ALDACTONE) 50 MG tablet, Take 1 tablet by mouth once daily, Disp: 90 tablet, Rfl: 0   colchicine 0.6 MG tablet, Take 1 tablet (0.6 mg total) by mouth daily. (Patient taking differently: Take 0.6 mg by mouth daily as needed.), Disp: 30 tablet, Rfl: 1   No Known Allergies   Review of Systems  Constitutional: Negative.   Respiratory:  Negative.    Gastrointestinal: Negative.   Skin: Negative.   Allergic/Immunologic: Negative.   Hematological: Negative.      Today's Vitals   08/29/21 0825  BP: 122/84  Temp: 98.1 F (36.7 C)  SpO2: 96%  Weight: 216 lb 3.2 oz (98.1 kg)  Height: 5' 5"  (1.651 m)  PainSc: 0-No pain   Body mass index is 35.98 kg/m.  Wt Readings from Last 3 Encounters:  08/29/21 216 lb 3.2 oz (98.1 kg)  04/11/21 219 lb 12.8 oz (99.7 kg)  02/08/21 221 lb 3.2 oz (100.3 kg)    Objective:  Physical Exam Vitals and nursing note reviewed.  Constitutional:      Appearance: Normal appearance. He is obese.  HENT:     Head: Normocephalic and atraumatic.  Eyes:     Extraocular Movements: Extraocular movements intact.  Cardiovascular:     Rate and Rhythm: Normal rate and regular rhythm.     Heart sounds: Normal heart sounds.  Pulmonary:     Effort: Pulmonary effort is normal.     Breath sounds: Normal breath sounds.  Musculoskeletal:     Cervical back: Normal range of motion.  Skin:    General: Skin is warm.     Findings: No rash.  Neurological:     General: No focal deficit present.     Mental  Status: He is alert.  Psychiatric:        Mood and Affect: Mood normal.      Assessment And Plan:     1. Essential hypertension, malignant Comments: Chronic, well controlled.  He will c/w carvedilol 12.23m twice daily and spironolactone 554mdaily. He will rto in April 2024 for his next physical exam.  - CMP14+EGFR  2. Prediabetes Comments: His a1c has been elevated in the past. I will recheck this today. He is encouraged to limit his intake of sweetened beverages. - Hemoglobin A1c  3. Chronic gout involving toe of left foot without tophus, unspecified cause Comments: He reports compliance with allopurinol daily. I will check uric acid level.  - Uric acid  4. Body mass index (BMI) of 35.0 to 35.9 Comments: He is encouraged to aim for at least 150 minutes of exercise per week.   5. Special  screening for malignant neoplasm of colon Comments: I will refer him to GI for CRC screening, he has h/o colonic polyps.  - Ambulatory referral to Gastroenterology  6. Personal history of colonic polyps - Ambulatory referral to Gastroenterology  7. Immunization due - Tdap vaccine greater than or equal to 7yo IM  Patient was given opportunity to ask questions. Patient verbalized understanding of the plan and was able to repeat key elements of the plan. All questions were answered to their satisfaction.   I, RoMaximino GreenlandMD, have reviewed all documentation for this visit. The documentation on 08/29/21 for the exam, diagnosis, procedures, and orders are all accurate and complete.   IF YOU HAVE BEEN REFERRED TO A SPECIALIST, IT MAY TAKE 1-2 WEEKS TO SCHEDULE/PROCESS THE REFERRAL. IF YOU HAVE NOT HEARD FROM US/SPECIALIST IN TWO WEEKS, PLEASE GIVE [place] CALL AT [phone].   THE PATIENT IS ENCOURAGED TO PRACTICE SOCIAL DISTANCING DUE TO THE COVID-19 PANDEMIC.

## 2021-08-29 NOTE — Patient Instructions (Signed)
Hypertension, Adult Hypertension is another name for high blood pressure. High blood pressure forces your heart to work harder to pump blood. This can cause problems over time. There are two numbers in a blood pressure reading. There is a top number (systolic) over a bottom number (diastolic). It is best to have a blood pressure that is below 120/80. What are the causes? The cause of this condition is not known. Some other conditions can lead to high blood pressure. What increases the risk? Some lifestyle factors can make you more likely to develop high blood pressure: Smoking. Not getting enough exercise or physical activity. Being overweight. Having too much fat, sugar, calories, or salt (sodium) in your diet. Drinking too much alcohol. Other risk factors include: Having any of these conditions: Heart disease. Diabetes. High cholesterol. Kidney disease. Obstructive sleep apnea. Having a family history of high blood pressure and high cholesterol. Age. The risk increases with age. Stress. What are the signs or symptoms? High blood pressure may not cause symptoms. Very high blood pressure (hypertensive crisis) may cause: Headache. Fast or uneven heartbeats (palpitations). Shortness of breath. Nosebleed. Vomiting or feeling like you may vomit (nauseous). Changes in how you see. Very bad chest pain. Feeling dizzy. Seizures. How is this treated? This condition is treated by making healthy lifestyle changes, such as: Eating healthy foods. Exercising more. Drinking less alcohol. Your doctor may prescribe medicine if lifestyle changes do not help enough and if: Your top number is above 130. Your bottom number is above 80. Your personal target blood pressure may vary. Follow these instructions at home: Eating and drinking  If told, follow the DASH eating plan. To follow this plan: Fill one half of your plate at each meal with fruits and vegetables. Fill one fourth of your plate  at each meal with whole grains. Whole grains include whole-wheat pasta, brown rice, and whole-grain bread. Eat or drink low-fat dairy products, such as skim milk or low-fat yogurt. Fill one fourth of your plate at each meal with low-fat (lean) proteins. Low-fat proteins include fish, chicken without skin, eggs, beans, and tofu. Avoid fatty meat, cured and processed meat, or chicken with skin. Avoid pre-made or processed food. Limit the amount of salt in your diet to less than 1,500 mg each day. Do not drink alcohol if: Your doctor tells you not to drink. You are pregnant, may be pregnant, or are planning to become pregnant. If you drink alcohol: Limit how much you have to: 0-1 drink a day for women. 0-2 drinks a day for men. Know how much alcohol is in your drink. In the [place] one drink equals one 12 oz bottle of beer (355 mL), one 5 oz glass of wine (148 mL), or one 1 oz glass of hard liquor (44 mL). Lifestyle  Work with your doctor to stay at a healthy weight or to lose weight. Ask your doctor what the best weight is for you. Get at least 30 minutes of exercise that causes your heart to beat faster (aerobic exercise) most days of the week. This may include walking, swimming, or biking. Get at least 30 minutes of exercise that strengthens your muscles (resistance exercise) at least 3 days a week. This may include lifting weights or doing Pilates. Do not smoke or use any products that contain nicotine or tobacco. If you need help quitting, ask your doctor. Check your blood pressure at home as told by your doctor. Keep all follow-up visits. Medicines Take over-the-counter and prescription medicines  only as told by your doctor. Follow directions carefully. Do not skip doses of blood pressure medicine. The medicine does not work as well if you skip doses. Skipping doses also puts you at risk for problems. Ask your doctor about side effects or reactions to medicines that you should watch  for. Contact a doctor if: You think you are having a reaction to the medicine you are taking. You have headaches that keep coming back. You feel dizzy. You have swelling in your ankles. You have trouble with your vision. Get help right away if: You get a very bad headache. You start to feel mixed up (confused). You feel weak or numb. You feel faint. You have very bad pain in your: Chest. Belly (abdomen). You vomit more than once. You have trouble breathing. These symptoms may be an emergency. Get help right away. Call 911. Do not wait to see if the symptoms will go away. Do not drive yourself to the hospital. Summary Hypertension is another name for high blood pressure. High blood pressure forces your heart to work harder to pump blood. For most people, a normal blood pressure is less than 120/80. Making healthy choices can help lower blood pressure. If your blood pressure does not get lower with healthy choices, you may need to take medicine. This information is not intended to replace advice given to you by your health care provider. Make sure you discuss any questions you have with your health care provider. Document Revised: 10/14/2020 Document Reviewed: 10/14/2020 Elsevier Patient Education  2023 [employer]  Prediabetes Prediabetes is when your blood sugar (blood glucose) level is higher than normal but not high enough for you to be diagnosed with type 2 diabetes. Having prediabetes puts you at risk for developing type 2 diabetes (type 2 diabetes mellitus). With certain lifestyle changes, you may be able to prevent or delay the onset of type 2 diabetes. This is important because type 2 diabetes can lead to serious complications, such as: Heart disease. Stroke. Blindness. Kidney disease. Depression. Poor circulation in the feet and legs. In severe cases, this could lead to surgical removal of a leg (amputation). What are the causes? The exact cause of prediabetes is not  known. It may result from insulin resistance. Insulin resistance develops when cells in the body do not respond properly to insulin that the body makes. This can cause excess glucose to build up in the blood. High blood glucose (hyperglycemia) can develop. What increases the risk? The following factors may make you more likely to develop this condition: You have a family member with type 2 diabetes. You are older than 45 years. You had a temporary form of diabetes during a pregnancy (gestational diabetes). You had polycystic ovary syndrome (PCOS). You are overweight or obese. You are inactive (sedentary). You have a history of heart disease, including problems with cholesterol levels, high levels of blood fats, or high blood pressure. What are the signs or symptoms? You may have no symptoms. If you do have symptoms, they may include: Increased hunger. Increased thirst. Increased urination. Vision changes, such as blurry vision. Tiredness (fatigue). How is this diagnosed? This condition can be diagnosed with blood tests. Your blood glucose may be checked with one or more of the following tests: A fasting blood glucose (FBG) test. You will not be allowed to eat (you will fast) for at least 8 hours before a blood sample is taken. An A1C blood test (hemoglobin A1C). This test provides information about blood glucose  levels over the previous 2?3 months. An oral glucose tolerance test (OGTT). This test measures your blood glucose at two points in time: After fasting. This is your baseline level. Two hours after you drink a beverage that contains glucose. You may be diagnosed with prediabetes if: Your FBG is 100?125 mg/dL (1.5-5.2 mmol/L). Your A1C level is 5.7?6.4% (39-46 mmol/mol). Your OGTT result is 140?199 mg/dL (0.8-02 mmol/L). These blood tests may be repeated to confirm your diagnosis. How is this treated? Treatment may include dietary and lifestyle changes to help lower your blood  glucose and prevent type 2 diabetes from developing. In some cases, medicine may be prescribed to help lower the risk of type 2 diabetes. Follow these instructions at home: Nutrition  Follow a healthy meal plan. This includes eating lean proteins, whole grains, legumes, fresh fruits and vegetables, low-fat dairy products, and healthy fats. Follow instructions from your health care provider about eating or drinking restrictions. Meet with a dietitian to create a healthy eating plan that is right for you. Lifestyle Do moderate-intensity exercise for at least 30 minutes a day on 5 or more days each week, or as told by your health care provider. A mix of activities may be best, such as: Brisk walking, swimming, biking, and weight lifting. Lose weight as told by your health care provider. Losing 5-7% of your body weight can reverse insulin resistance. Do not drink alcohol if: Your health care provider tells you not to drink. You are pregnant, may be pregnant, or are planning to become pregnant. If you drink alcohol: Limit how much you use to: 0-1 drink a day for women. 0-2 drinks a day for men. Be aware of how much alcohol is in your drink. In the [place] one drink equals one 12 oz bottle of beer (355 mL), one 5 oz glass of wine (148 mL), or one 1 oz glass of hard liquor (44 mL). General instructions Take over-the-counter and prescription medicines only as told by your health care provider. You may be prescribed medicines that help lower the risk of type 2 diabetes. Do not use any products that contain nicotine or tobacco, such as cigarettes, e-cigarettes, and chewing tobacco. If you need help quitting, ask your health care provider. Keep all follow-up visits. This is important. Where to find more information American Diabetes Association: www.diabetes.org Academy of Nutrition and Dietetics: www.eatright.org American Heart Association: www.heart.org Contact a health care provider if: You have  any of these symptoms: Increased hunger. Increased urination. Increased thirst. Fatigue. Vision changes, such as blurry vision. Get help right away if you: Have shortness of breath. Feel confused. Vomit or feel like you may vomit. Summary Prediabetes is when your blood sugar (blood glucose)level is higher than normal but not high enough for you to be diagnosed with type 2 diabetes. Having prediabetes puts you at risk for developing type 2 diabetes (type 2 diabetes mellitus). Make lifestyle changes such as eating a healthy diet and exercising regularly to help prevent diabetes. Lose weight as told by your health care provider. This information is not intended to replace advice given to you by your health care provider. Make sure you discuss any questions you have with your health care provider. Document Revised: 03/27/2019 Document Reviewed: 03/27/2019 Elsevier Patient Education  2023 [employer].

## 2021-09-01 LAB — CMP14+EGFR
ALT: 39 IU/L (ref 0–44)
AST: 25 IU/L (ref 0–40)
Albumin/Globulin Ratio: 1.8 (ref 1.2–2.2)
Albumin: 4.5 g/dL (ref 3.9–4.9)
Alkaline Phosphatase: 72 IU/L (ref 44–121)
BUN/Creatinine Ratio: 19 (ref 10–24)
BUN: 22 mg/dL (ref 8–27)
Bilirubin Total: 0.7 mg/dL (ref 0.0–1.2)
CO2: 22 mmol/L (ref 20–29)
Calcium: 9.5 mg/dL (ref 8.6–10.2)
Chloride: 105 mmol/L (ref 96–106)
Creatinine, Ser: 1.17 mg/dL (ref 0.76–1.27)
Globulin, Total: 2.5 g/dL (ref 1.5–4.5)
Glucose: 109 mg/dL — ABNORMAL HIGH (ref 70–99)
Potassium: 4.3 mmol/L (ref 3.5–5.2)
Sodium: 144 mmol/L (ref 134–144)
Total Protein: 7 g/dL (ref 6.0–8.5)
eGFR: 69 mL/min/{1.73_m2} (ref >59)

## 2021-09-01 LAB — HEMOGLOBIN A1C
Est. average glucose Bld gHb Est-mCnc: 126 mg/dL
Hgb A1c MFr Bld: 6 % — ABNORMAL HIGH (ref 4.8–5.6)

## 2021-09-01 LAB — URIC ACID: Uric Acid: 6.1 mg/dL (ref 3.8–8.4)

## 2021-09-26 ENCOUNTER — Other Ambulatory Visit: Payer: Self-pay | Admitting: Internal Medicine

## 2021-10-20 ENCOUNTER — Other Ambulatory Visit: Payer: Self-pay | Admitting: Internal Medicine

## 2021-12-19 ENCOUNTER — Other Ambulatory Visit: Payer: Self-pay | Admitting: Internal Medicine

## 2021-12-30 ENCOUNTER — Other Ambulatory Visit: Payer: Self-pay | Admitting: Internal Medicine

## 2021-12-31 ENCOUNTER — Other Ambulatory Visit: Payer: Self-pay | Admitting: Internal Medicine

## 2022-02-10 ENCOUNTER — Other Ambulatory Visit: Payer: Self-pay | Admitting: Internal Medicine

## 2022-02-16 IMAGING — DX DG ELBOW COMPLETE 3+V*R*
4 series · 4 of 4 positions shown · non-contrast
Comparison: None.

CLINICAL DATA: pain

EXAM:
RIGHT ELBOW - COMPLETE 3+ VIEW

[x elbow ap right]
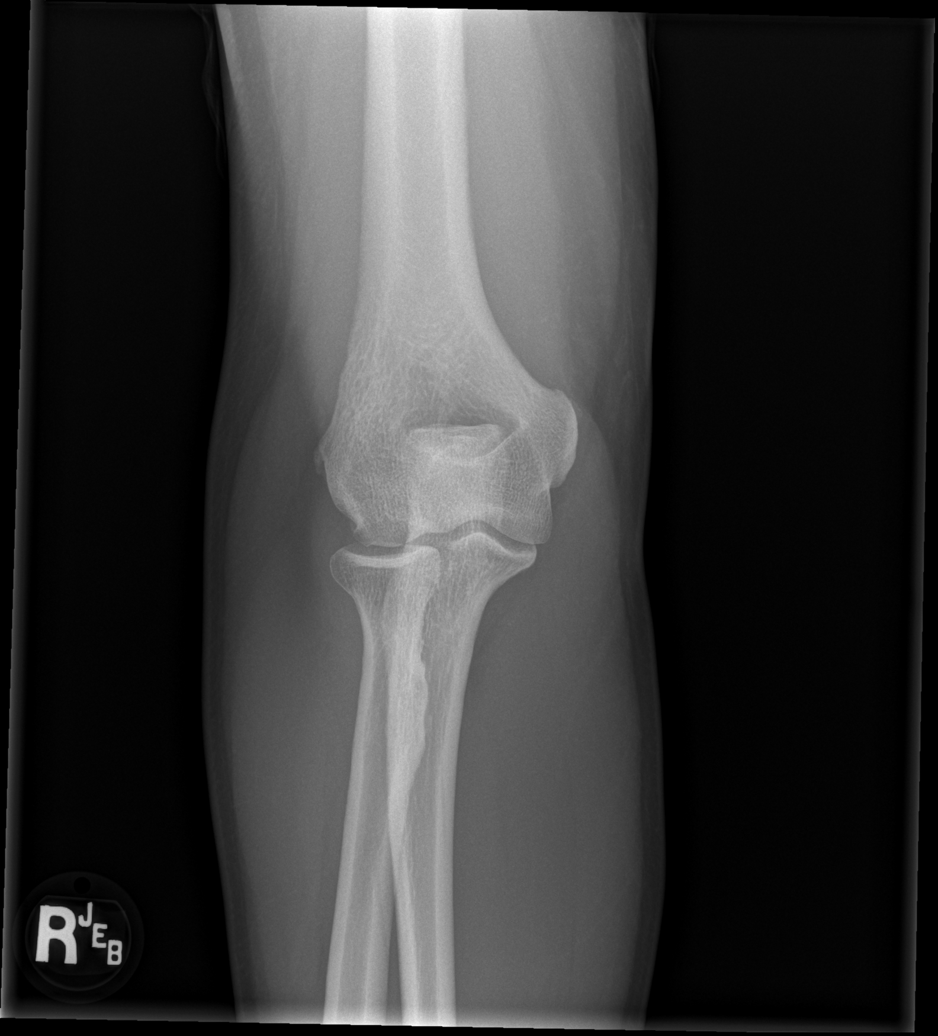

[x elbow obl right (1 of 2)]
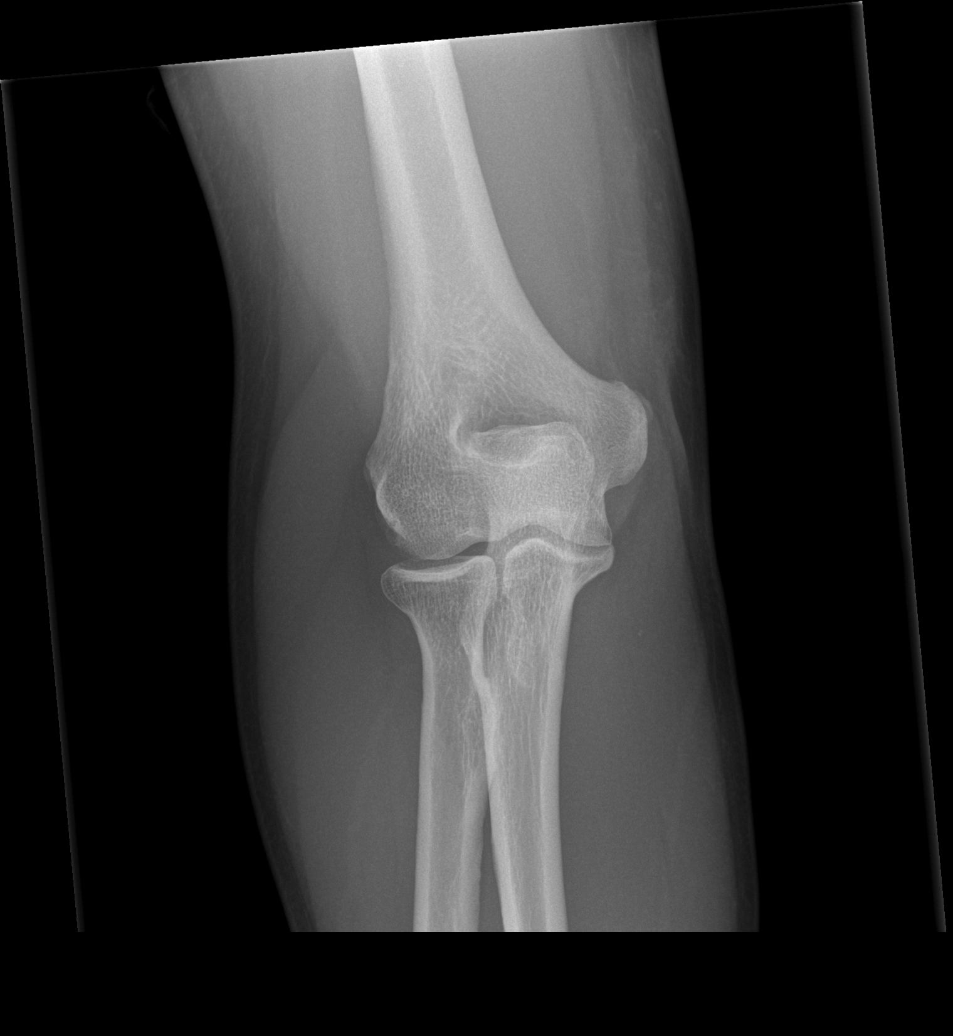

[x elbow obl right (2 of 2)]
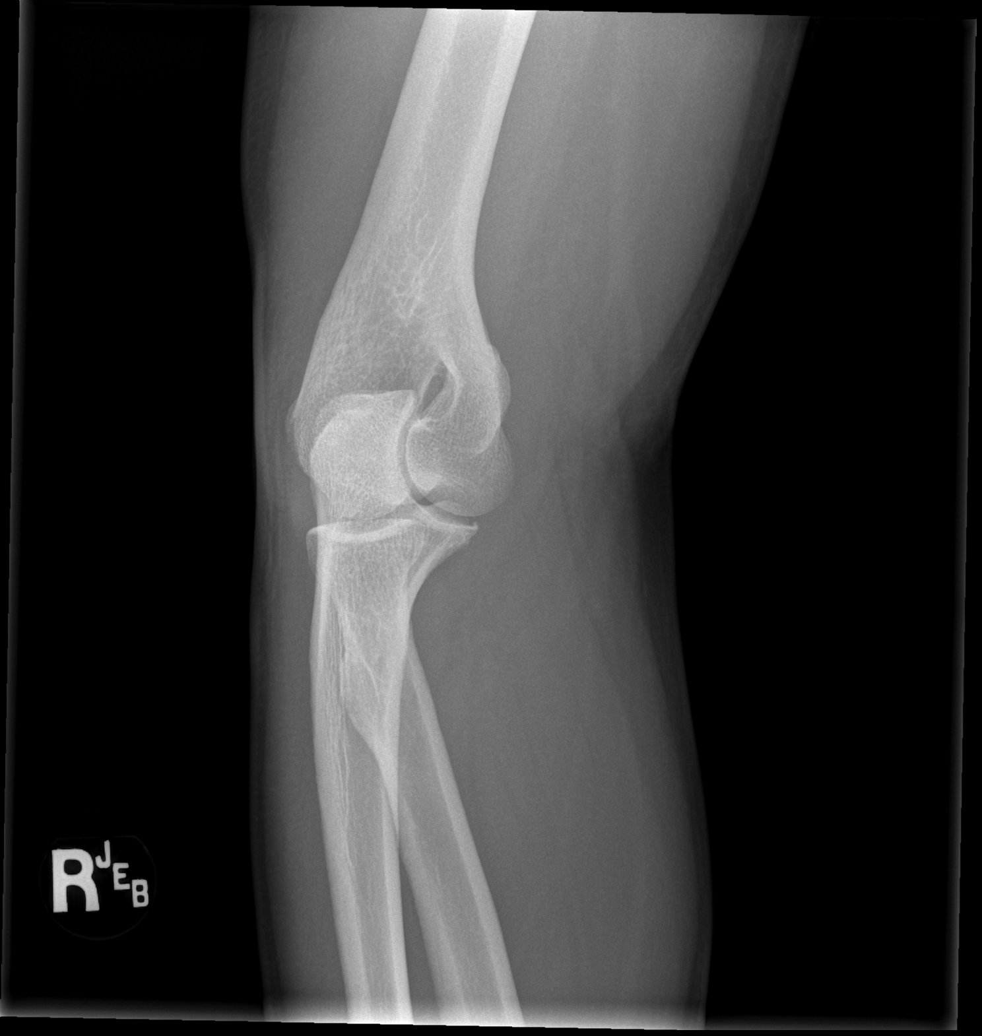

[x elbow lat right]
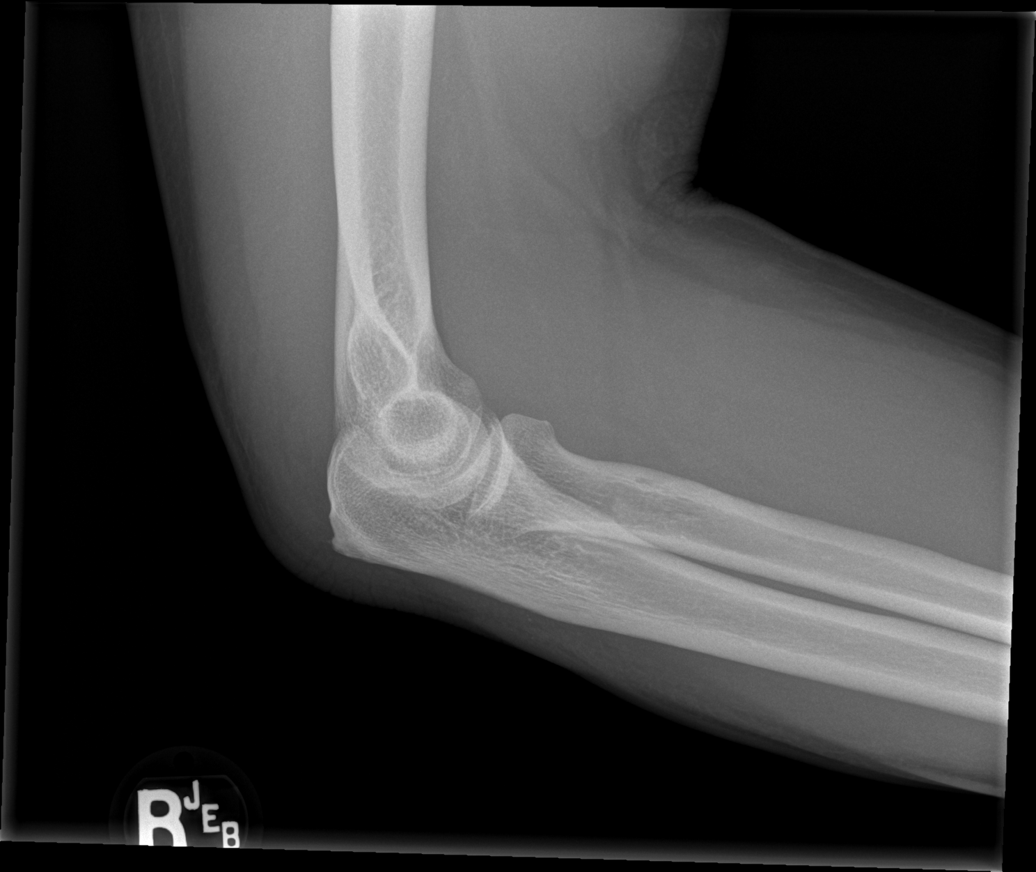

[4 of 4 positions shown; findings below may reference images not displayed]

FINDINGS: No acute fracture or dislocation. Joint spaces and alignment are
maintained. No area of erosion or osseous destruction. No unexpected
radiopaque foreign body. Soft tissues are unremarkable.
IMPRESSION: No acute fracture or dislocation.

## 2022-03-15 ENCOUNTER — Other Ambulatory Visit: Payer: Self-pay | Admitting: Internal Medicine

## 2022-03-30 ENCOUNTER — Other Ambulatory Visit: Payer: Self-pay | Admitting: Internal Medicine

## 2022-03-31 MED ORDER — ALLOPURINOL 100 MG PO TABS: 100.0000 mg | ORAL_TABLET | Freq: Every day | ORAL | 0 refills | Status: DC

## 2022-05-08 ENCOUNTER — Encounter: Payer: Self-pay | Admitting: Internal Medicine

## 2022-05-08 ENCOUNTER — Ambulatory Visit (INDEPENDENT_AMBULATORY_CARE_PROVIDER_SITE_OTHER): Payer: BC Managed Care – PPO | Admitting: Internal Medicine

## 2022-05-08 VITALS — BP 108/72 | HR 74 | Temp 97.6°F | Ht 65.0 in | Wt 220.2 lb

## 2022-05-08 DIAGNOSIS — Z86010 Personal history of colonic polyps: Secondary | ICD-10-CM

## 2022-05-08 DIAGNOSIS — Z6836 Body mass index (BMI) 36.0-36.9, adult: Secondary | ICD-10-CM

## 2022-05-08 DIAGNOSIS — R351 Nocturia: Secondary | ICD-10-CM

## 2022-05-08 DIAGNOSIS — I10 Essential (primary) hypertension: Secondary | ICD-10-CM | POA: Diagnosis not present

## 2022-05-08 DIAGNOSIS — R7303 Prediabetes: Secondary | ICD-10-CM

## 2022-05-08 LAB — POCT URINALYSIS DIPSTICK
Blood, UA: NEGATIVE
Glucose, UA: NEGATIVE
Ketones, UA: NEGATIVE
Leukocytes, UA: NEGATIVE
Nitrite, UA: NEGATIVE
Protein, UA: NEGATIVE
Spec Grav, UA: >=1.03 — AB (ref 1.010–1.025)
Urobilinogen, UA: 0.2 E.U./dL
pH, UA: 5.5 (ref 5.0–8.0)

## 2022-05-08 NOTE — Patient Instructions (Signed)
Health Maintenance, Male Adopting a healthy lifestyle and getting preventive care are important in promoting health and wellness. Ask your health care provider about: The right schedule for you to have regular tests and exams. Things you can do on your own to prevent diseases and keep yourself healthy. What should I know about diet, weight, and exercise? Eat a healthy diet  Eat a diet that includes plenty of vegetables, fruits, low-fat dairy products, and lean protein. Do not eat a lot of foods that are high in solid fats, added sugars, or sodium. Maintain a healthy weight Body mass index (BMI) is a measurement that can be used to identify possible weight problems. It estimates body fat based on height and weight. Your health care provider can help determine your BMI and help you achieve or maintain a healthy weight. Get regular exercise Get regular exercise. This is one of the most important things you can do for your health. Most adults should: Exercise for at least 150 minutes each week. The exercise should increase your heart rate and make you sweat (moderate-intensity exercise). Do strengthening exercises at least twice a week. This is in addition to the moderate-intensity exercise. Spend less time sitting. Even light physical activity can be beneficial. Watch cholesterol and blood lipids Have your blood tested for lipids and cholesterol at 67 years of age, then have this test every 5 years. You may need to have your cholesterol levels checked more often if: Your lipid or cholesterol levels are high. You are older than 67 years of age. You are at high risk for heart disease. What should I know about cancer screening? Many types of cancers can be detected early and may often be prevented. Depending on your health history and family history, you may need to have cancer screening at various ages. This may include screening for: Colorectal cancer. Prostate cancer. Skin cancer. Lung  cancer. What should I know about heart disease, diabetes, and high blood pressure? Blood pressure and heart disease High blood pressure causes heart disease and increases the risk of stroke. This is more likely to develop in people who have high blood pressure readings or are overweight. Talk with your health care provider about your target blood pressure readings. Have your blood pressure checked: Every 3-5 years if you are 18-39 years of age. Every year if you are 40 years old or older. If you are between the ages of 65 and 75 and are a current or former smoker, ask your health care provider if you should have a one-time screening for abdominal aortic aneurysm (AAA). Diabetes Have regular diabetes screenings. This checks your fasting blood sugar level. Have the screening done: Once every three years after age 45 if you are at a normal weight and have a low risk for diabetes. More often and at a younger age if you are overweight or have a high risk for diabetes. What should I know about preventing infection? Hepatitis B If you have a higher risk for hepatitis B, you should be screened for this virus. Talk with your health care provider to find out if you are at risk for hepatitis B infection. Hepatitis C Blood testing is recommended for: Everyone born from 1945 through 1965. Anyone with known risk factors for hepatitis C. Sexually transmitted infections (STIs) You should be screened each year for STIs, including gonorrhea and chlamydia, if: You are sexually active and are younger than 67 years of age. You are older than 67 years of age and your   health care provider tells you that you are at risk for this type of infection. Your sexual activity has changed since you were last screened, and you are at increased risk for chlamydia or gonorrhea. Ask your health care provider if you are at risk. Ask your health care provider about whether you are at high risk for HIV. Your health care provider  may recommend a prescription medicine to help prevent HIV infection. If you choose to take medicine to prevent HIV, you should first get tested for HIV. You should then be tested every 3 months for as long as you are taking the medicine. Follow these instructions at home: Alcohol use Do not drink alcohol if your health care provider tells you not to drink. If you drink alcohol: Limit how much you have to 0-2 drinks a day. Know how much alcohol is in your drink. In the [place] one drink equals one 12 oz bottle of beer (355 mL), one 5 oz glass of wine (148 mL), or one 1 oz glass of hard liquor (44 mL). Lifestyle Do not use any products that contain nicotine or tobacco. These products include cigarettes, chewing tobacco, and vaping devices, such as e-cigarettes. If you need help quitting, ask your health care provider. Do not use street drugs. Do not share needles. Ask your health care provider for help if you need support or information about quitting drugs. General instructions Schedule regular health, dental, and eye exams. Stay current with your vaccines. Tell your health care provider if: You often feel depressed. You have ever been abused or do not feel safe at home. Summary Adopting a healthy lifestyle and getting preventive care are important in promoting health and wellness. Follow your health care provider's instructions about healthy diet, exercising, and getting tested or screened for diseases. Follow your health care provider's instructions on monitoring your cholesterol and blood pressure. This information is not intended to replace advice given to you by your health care provider. Make sure you discuss any questions you have with your health care provider. Document Revised: 05/17/2020 Document Reviewed: 05/17/2020 Elsevier Patient Education  2023 [employer]  

## 2022-05-08 NOTE — Progress Notes (Signed)
I,Jerome Pacheco,acting as a scribe for Jerome Aliment, MD.,have documented all relevant documentation on the behalf of Jerome Aliment, MD,as directed by  Jerome Aliment, MD while in the presence of Jerome Aliment, MD.   Subjective:     Patient ID: Jerome Pacheco , male    DOB: 1955/11/24 , 67 y.o.   MRN: [id number]   Chief Complaint  Patient presents with   Prediabetes   Hypertension    HPI  He presented today for CPE; however, he needs [facility] visit, so this will be rescheduled.  He has no specific concerns at this time . He reports compliance with meds. He denies headaches, chest pain and shortness of breath.   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   Hypertension This is a chronic problem. The current episode started more than 1 year ago. The problem has been gradually improving since onset. The problem is controlled. Pertinent negatives include no blurred vision, chest pain, palpitations or shortness of breath. Anxiety: ekg.Risk factors for coronary artery disease include male gender and obesity.     Past Medical History:  Diagnosis Date   Hypertension    Sleep apnea      Family History  Problem Relation Age of Onset   Heart disease Father    Cancer Mother      Current Outpatient Medications:    allopurinol (ZYLOPRIM) 100 MG tablet, Take 1 tablet (100 mg total) by mouth daily., Disp: 90 tablet, Rfl: 0   carvedilol (COREG) 12.5 MG tablet, TAKE 1 TABLET BY MOUTH TWICE DAILY WITH A MEAL, Disp: 180 tablet, Rfl: 0   ibuprofen (ADVIL) 600 MG tablet, Take 1 tablet (600 mg total) by mouth every 6 (six) hours as needed., Disp: 30 tablet, Rfl: 0   Olmesartan-amLODIPine-HCTZ 40-10-25 MG TABS, TAKE (1) TABLET BY MOUTH EACH MORNING., Disp: 90 tablet, Rfl: 0   spironolactone (ALDACTONE) 50 MG tablet, Take 1 tablet by mouth once daily, Disp: 90 tablet, Rfl: 0   colchicine 0.6 MG tablet, Take 1 tablet (0.6 mg total) by mouth daily. (Patient taking differently: Take 0.6 mg  by mouth daily as needed.), Disp: 30 tablet, Rfl: 1   No Known Allergies   Men's preventive visit. Patient Health Questionnaire (PHQ-2) is  Flowsheet Row Office Visit from 05/08/2022 in [facility]  PHQ-2 Total Score 0     . Patient is on a low sodium diet. Marital status: Married. Relevant history for alcohol use is:  Social History   Substance and Sexual Activity  Alcohol Use No   Comment: quit 2012  . Relevant history for tobacco use is:  Social History   Tobacco Use  Smoking Status Never  Smokeless Tobacco Never  .   Review of Systems  Constitutional: Negative.   Eyes:  Negative for blurred vision.  Respiratory: Negative.  Negative for shortness of breath.   Cardiovascular: Negative.  Negative for chest pain and palpitations.  Musculoskeletal: Negative.   Skin: Negative.   Allergic/Immunologic: Negative.   Neurological: Negative.   Hematological: Negative.      Today's Vitals   05/08/22 0852  BP: 108/72  Pulse: 74  Temp: 97.6 F (36.4 C)  SpO2: 98%  Weight: 220 lb 3.2 oz (99.9 kg)  Height: 5\' 5"  (1.651 m)   Body mass index is 36.64 kg/m.  Wt Readings from Last 3 Encounters:  05/08/22 220 lb 3.2 oz (99.9 kg)  08/29/21 216 lb 3.2 oz (98.1 kg)  04/11/21 219 lb 12.8 oz (99.7 kg)    Objective:  Physical Exam Vitals and nursing note reviewed.  Constitutional:      Appearance: Normal appearance.  HENT:     Head: Normocephalic and atraumatic.  Eyes:     Extraocular Movements: Extraocular movements intact.  Cardiovascular:     Rate and Rhythm: Normal rate and regular rhythm.     Heart sounds: Normal heart sounds.  Pulmonary:     Effort: Pulmonary effort is normal.     Breath sounds: Normal breath sounds.  Musculoskeletal:     Cervical back: Normal range of motion.  Skin:    General: Skin is warm.  Neurological:     General: No focal deficit present.     Mental Status: He is alert.  Psychiatric:        Mood and  Affect: Mood normal.      Assessment And Plan:    1. Essential hypertension, malignant Comments: Chronic, well controlled. He will c/w olmesartan/amlodipine/hctz, spironolactone and carvedilol. He is encouraged to follow low sodium diet. F/u 4 months. - POCT Urinalysis Dipstick (81002) - Microalbumin / creatinine urine ratio - CMP14+EGFR - Lipid panel - CBC  2. Prediabetes Comments: Previous labs reviewed, a1c has been elevated in the past. I will recheck an a1c today. He is encouraged to decrease intake of sugary foods/drinks. - CMP14+EGFR - Hemoglobin A1c  3. Nocturia Comments: Occasional, I will check serum PSA today. - PSA  4. Class 2 severe obesity due to excess calories with serious comorbidity and body mass index (BMI) of 36.0 to 36.9 in adult [facility]) Comments: He is encouraged to strive for BMI less than 30 to decrease cardiac risk. Advised to aim for at least 150 minutes of exercise per week.  5. Personal history of colonic polyps   Patient was given opportunity to ask questions. Patient verbalized understanding of the plan and was able to repeat key elements of the plan. All questions were answered to their satisfaction.   I, Jerome Aliment, MD, have reviewed all documentation for this visit. The documentation on 05/08/22 for the exam, diagnosis, procedures, and orders are all accurate and complete.   THE PATIENT IS ENCOURAGED TO PRACTICE SOCIAL DISTANCING DUE TO THE COVID-19 PANDEMIC.

## 2022-05-09 LAB — CMP14+EGFR
ALT: 52 IU/L — ABNORMAL HIGH (ref 0–44)
AST: 32 IU/L (ref 0–40)
Albumin/Globulin Ratio: 1.7 (ref 1.2–2.2)
Albumin: 4.5 g/dL (ref 3.9–4.9)
Alkaline Phosphatase: 76 IU/L (ref 44–121)
BUN/Creatinine Ratio: 16 (ref 10–24)
BUN: 24 mg/dL (ref 8–27)
Bilirubin Total: 1 mg/dL (ref 0.0–1.2)
CO2: 22 mmol/L (ref 20–29)
Calcium: 9.8 mg/dL (ref 8.6–10.2)
Chloride: 104 mmol/L (ref 96–106)
Creatinine, Ser: 1.5 mg/dL — ABNORMAL HIGH (ref 0.76–1.27)
Globulin, Total: 2.6 g/dL (ref 1.5–4.5)
Glucose: 140 mg/dL — ABNORMAL HIGH (ref 70–99)
Potassium: 3.6 mmol/L (ref 3.5–5.2)
Sodium: 143 mmol/L (ref 134–144)
Total Protein: 7.1 g/dL (ref 6.0–8.5)
eGFR: 51 mL/min/{1.73_m2} — ABNORMAL LOW (ref >59)

## 2022-05-09 LAB — CBC
Hematocrit: 40.1 % (ref 37.5–51.0)
Hemoglobin: 13.5 g/dL (ref 13.0–17.7)
MCH: 30.3 pg (ref 26.6–33.0)
MCHC: 33.7 g/dL (ref 31.5–35.7)
MCV: 90 fL (ref 79–97)
Platelets: 250 10*3/uL (ref 150–450)
RBC: 4.45 x10E6/uL (ref 4.14–5.80)
RDW: 13.7 % (ref 11.6–15.4)
WBC: 6.5 10*3/uL (ref 3.4–10.8)

## 2022-05-09 LAB — LIPID PANEL
Chol/HDL Ratio: 3.8 ratio (ref 0.0–5.0)
Cholesterol, Total: 162 mg/dL (ref 100–199)
HDL: 43 mg/dL (ref >39)
LDL Chol Calc (NIH): 95 mg/dL (ref 0–99)
Triglycerides: 132 mg/dL (ref 0–149)
VLDL Cholesterol Cal: 24 mg/dL (ref 5–40)

## 2022-05-09 LAB — HEMOGLOBIN A1C
Est. average glucose Bld gHb Est-mCnc: 137 mg/dL
Hgb A1c MFr Bld: 6.4 % — ABNORMAL HIGH (ref 4.8–5.6)

## 2022-05-09 LAB — PSA: Prostate Specific Ag, Serum: 0.8 ng/mL (ref 0.0–4.0)

## 2022-05-09 LAB — MICROALBUMIN / CREATININE URINE RATIO
Creatinine, Urine: 318.1 mg/dL
Microalb/Creat Ratio: 5 mg/g creat (ref 0–29)
Microalbumin, Urine: 16.3 ug/mL

## 2022-05-12 ENCOUNTER — Other Ambulatory Visit: Payer: Self-pay | Admitting: Internal Medicine

## 2022-06-08 ENCOUNTER — Other Ambulatory Visit: Payer: Self-pay | Admitting: Internal Medicine

## 2022-06-09 ENCOUNTER — Other Ambulatory Visit: Payer: Self-pay | Admitting: Internal Medicine

## 2022-06-27 ENCOUNTER — Other Ambulatory Visit: Payer: Self-pay | Admitting: Internal Medicine

## 2022-08-29 ENCOUNTER — Other Ambulatory Visit: Payer: Self-pay | Admitting: Internal Medicine

## 2022-09-25 ENCOUNTER — Encounter: Payer: BC Managed Care – PPO | Admitting: Internal Medicine

## 2022-10-24 ENCOUNTER — Other Ambulatory Visit: Payer: Self-pay | Admitting: Internal Medicine

## 2022-11-23 ENCOUNTER — Other Ambulatory Visit: Payer: Self-pay | Admitting: Internal Medicine

## 2022-11-23 ENCOUNTER — Ambulatory Visit: Payer: Medicare Other | Admitting: Internal Medicine

## 2022-11-23 ENCOUNTER — Encounter: Payer: Self-pay | Admitting: Internal Medicine

## 2022-11-23 VITALS — BP 120/82 | HR 78 | Temp 97.9°F | Ht 65.0 in | Wt 216.2 lb

## 2022-11-23 DIAGNOSIS — Z23 Encounter for immunization: Secondary | ICD-10-CM

## 2022-11-23 DIAGNOSIS — Z860109 Personal history of other colon polyps: Secondary | ICD-10-CM

## 2022-11-23 DIAGNOSIS — I10 Essential (primary) hypertension: Secondary | ICD-10-CM

## 2022-11-23 DIAGNOSIS — Z0000 Encounter for general adult medical examination without abnormal findings: Secondary | ICD-10-CM

## 2022-11-23 DIAGNOSIS — R7303 Prediabetes: Secondary | ICD-10-CM

## 2022-11-23 DIAGNOSIS — Z6835 Body mass index (BMI) 35.0-35.9, adult: Secondary | ICD-10-CM

## 2022-11-23 DIAGNOSIS — M1A9XX0 Chronic gout, unspecified, without tophus (tophi): Secondary | ICD-10-CM

## 2022-11-23 DIAGNOSIS — B360 Pityriasis versicolor: Secondary | ICD-10-CM

## 2022-11-23 DIAGNOSIS — E66812 Obesity, class 2: Secondary | ICD-10-CM

## 2022-11-23 LAB — POCT URINALYSIS DIPSTICK
Blood, UA: NEGATIVE
Glucose, UA: NEGATIVE
Ketones, UA: NEGATIVE
Leukocytes, UA: NEGATIVE
Nitrite, UA: NEGATIVE
Protein, UA: NEGATIVE
Spec Grav, UA: 1.025 (ref 1.010–1.025)
Urobilinogen, UA: 1 U/dL
pH, UA: 6 (ref 5.0–8.0)

## 2022-11-23 MED ORDER — ALLOPURINOL 100 MG PO TABS: 100.0000 mg | ORAL_TABLET | Freq: Every day | ORAL | 2 refills | Status: DC

## 2022-11-23 MED ORDER — SPIRONOLACTONE 50 MG PO TABS: 50.0000 mg | ORAL_TABLET | Freq: Every day | ORAL | 2 refills | Status: DC

## 2022-11-23 MED ORDER — CARVEDILOL 12.5 MG PO TABS: 12.5000 mg | ORAL_TABLET | Freq: Two times a day (BID) | ORAL | 2 refills | Status: DC

## 2022-11-23 MED ORDER — COLCHICINE 0.6 MG PO TABS: 0.6000 mg | ORAL_TABLET | Freq: Every day | ORAL | 0 refills | Status: AC | PRN

## 2022-11-23 MED ORDER — OLMESARTAN-AMLODIPINE-HCTZ 40-10-25 MG PO TABS: ORAL_TABLET | ORAL | 2 refills | Status: DC

## 2022-11-23 NOTE — Patient Instructions (Addendum)
Selsun Blue - lather, let sit for ten minutes, then rinse x 7 days  Health Maintenance, Male Adopting a healthy lifestyle and getting preventive care are important in promoting health and wellness. Ask your health care provider about: The right schedule for you to have regular tests and exams. Things you can do on your own to prevent diseases and keep yourself healthy. What should I know about diet, weight, and exercise? Eat a healthy diet  Eat a diet that includes plenty of vegetables, fruits, low-fat dairy products, and lean protein. Do not eat a lot of foods that are high in solid fats, added sugars, or sodium. Maintain a healthy weight Body mass index (BMI) is a measurement that can be used to identify possible weight problems. It estimates body fat based on height and weight. Your health care provider can help determine your BMI and help you achieve or maintain a healthy weight. Get regular exercise Get regular exercise. This is one of the most important things you can do for your health. Most adults should: Exercise for at least 150 minutes each week. The exercise should increase your heart rate and make you sweat (moderate-intensity exercise). Do strengthening exercises at least twice a week. This is in addition to the moderate-intensity exercise. Spend less time sitting. Even light physical activity can be beneficial. Watch cholesterol and blood lipids Have your blood tested for lipids and cholesterol at 67 years of age, then have this test every 5 years. You may need to have your cholesterol levels checked more often if: Your lipid or cholesterol levels are high. You are older than 67 years of age. You are at high risk for heart disease. What should I know about cancer screening? Many types of cancers can be detected early and may often be prevented. Depending on your health history and family history, you may need to have cancer screening at various ages. This may include screening  for: Colorectal cancer. Prostate cancer. Skin cancer. Lung cancer. What should I know about heart disease, diabetes, and high blood pressure? Blood pressure and heart disease High blood pressure causes heart disease and increases the risk of stroke. This is more likely to develop in people who have high blood pressure readings or are overweight. Talk with your health care provider about your target blood pressure readings. Have your blood pressure checked: Every 3-5 years if you are 72-61 years of age. Every year if you are 27 years old or older. If you are between the ages of 72 and 10 and are a current or former smoker, ask your health care provider if you should have a one-time screening for abdominal aortic aneurysm (AAA). Diabetes Have regular diabetes screenings. This checks your fasting blood sugar level. Have the screening done: Once every three years after age 32 if you are at a normal weight and have a low risk for diabetes. More often and at a younger age if you are overweight or have a high risk for diabetes. What should I know about preventing infection? Hepatitis B If you have a higher risk for hepatitis B, you should be screened for this virus. Talk with your health care provider to find out if you are at risk for hepatitis B infection. Hepatitis C Blood testing is recommended for: Everyone born from [birth year] through 1965. Anyone with known risk factors for hepatitis C. Sexually transmitted infections (STIs) You should be screened each year for STIs, including gonorrhea and chlamydia, if: You are sexually active and are younger  than 67 years of age. You are older than 67 years of age and your health care provider tells you that you are at risk for this type of infection. Your sexual activity has changed since you were last screened, and you are at increased risk for chlamydia or gonorrhea. Ask your health care provider if you are at risk. Ask your health care provider about  whether you are at high risk for HIV. Your health care provider may recommend a prescription medicine to help prevent HIV infection. If you choose to take medicine to prevent HIV, you should first get tested for HIV. You should then be tested every 3 months for as long as you are taking the medicine. Follow these instructions at home: Alcohol use Do not drink alcohol if your health care provider tells you not to drink. If you drink alcohol: Limit how much you have to 0-2 drinks a day. Know how much alcohol is in your drink. In the [place] one drink equals one 12 oz bottle of beer (355 mL), one 5 oz glass of wine (148 mL), or one 1 oz glass of hard liquor (44 mL). Lifestyle Do not use any products that contain nicotine or tobacco. These products include cigarettes, chewing tobacco, and vaping devices, such as e-cigarettes. If you need help quitting, ask your health care provider. Do not use street drugs. Do not share needles. Ask your health care provider for help if you need support or information about quitting drugs. General instructions Schedule regular health, dental, and eye exams. Stay current with your vaccines. Tell your health care provider if: You often feel depressed. You have ever been abused or do not feel safe at home. Summary Adopting a healthy lifestyle and getting preventive care are important in promoting health and wellness. Follow your health care provider's instructions about healthy diet, exercising, and getting tested or screened for diseases. Follow your health care provider's instructions on monitoring your cholesterol and blood pressure. This information is not intended to replace advice given to you by your health care provider. Make sure you discuss any questions you have with your health care provider. Document Revised: 05/17/2020 Document Reviewed: 05/17/2020 Elsevier Patient Education  2024 [employer].

## 2022-11-23 NOTE — Progress Notes (Signed)
Subjective:    Jerome Pacheco is a 67 y.o. male who presents for a Welcome to Medicare exam.  He reports compliance with meds. Denies having any headaches, chest pain and shortness of breath. He has been playing a lot of golf since retirement. He is not yet bored.    He denies having any recent hospitalizations.   Cardiac Risk Factors include: advanced age (>9men, >75 women);sedentary lifestyle;obesity (BMI >30kg/m2);hypertension;male gender  Review of Systems  Constitutional: Negative.   HENT: Negative.    Eyes: Negative.   Respiratory: Negative.    Cardiovascular: Negative.   Gastrointestinal: Negative.   Genitourinary: Negative.   Musculoskeletal: Negative.   Skin: Negative.   Neurological: Negative.   Endo/Heme/Allergies: Negative.   Psychiatric/Behavioral: Negative.          Objective:    Today's Vitals   11/23/22 1448  BP: 120/82  Pulse: 78  Temp: 97.9 F (36.6 C)  SpO2: 98%  Weight: 216 lb 3.2 oz (98.1 kg)  Height: 5\' 5"  (1.651 m)   Body mass index is 35.98 kg/m.  Wt Readings from Last 3 Encounters:  11/23/22 216 lb 3.2 oz (98.1 kg)  05/08/22 220 lb 3.2 oz (99.9 kg)  08/29/21 216 lb 3.2 oz (98.1 kg)     Medications Outpatient Encounter Medications as of 11/23/2022  Medication Sig   ibuprofen (ADVIL) 600 MG tablet Take 1 tablet (600 mg total) by mouth every 6 (six) hours as needed.   [DISCONTINUED] allopurinol (ZYLOPRIM) 100 MG tablet Take 1 tablet by mouth once daily   [DISCONTINUED] carvedilol (COREG) 12.5 MG tablet TAKE 1 TABLET BY MOUTH TWICE DAILY WITH A MEAL   [DISCONTINUED] Olmesartan-amLODIPine-HCTZ 40-10-25 MG TABS TAKE (1) TABLET BY MOUTH EACH MORNING.   [DISCONTINUED] spironolactone (ALDACTONE) 50 MG tablet Take 1 tablet by mouth once daily   allopurinol (ZYLOPRIM) 100 MG tablet Take 1 tablet (100 mg total) by mouth daily.   carvedilol (COREG) 12.5 MG tablet Take 1 tablet (12.5 mg total) by mouth 2 (two) times daily with a meal.    colchicine 0.6 MG tablet Take 1 tablet (0.6 mg total) by mouth daily as needed.   Olmesartan-amLODIPine-HCTZ 40-10-25 MG TABS TAKE (1) TABLET BY MOUTH EACH MORNING.   spironolactone (ALDACTONE) 50 MG tablet Take 1 tablet (50 mg total) by mouth daily.   [DISCONTINUED] colchicine 0.6 MG tablet Take 1 tablet (0.6 mg total) by mouth daily. (Patient taking differently: Take 0.6 mg by mouth daily as needed.)   No facility-administered encounter medications on file as of 11/23/2022.     History: Past Medical History:  Diagnosis Date   Hypertension    Sleep apnea    Past Surgical History:  Procedure Laterality Date   APPENDECTOMY  1993   CHOLECYSTECTOMY  2010    Family History  Problem Relation Age of Onset   Heart disease Father    Cancer Mother    Social History   Occupational History   Occupation: Agricultural engineer: [employer]  Tobacco Use   Smoking status: Never   Smokeless tobacco: Never  Vaping Use   Vaping status: Never Used  Substance and Sexual Activity   Alcohol use: No    Comment: quit 2012   Drug use: No   Sexual activity: Not on file    Tobacco Counseling Counseling given: Yes   Immunizations and Health Maintenance Immunization History  Administered Date(s) Administered   Fluad Trivalent(High Dose 65+) 11/23/2022   Influenza,inj,Quad PF,6+ Mos 10/10/2017, 09/25/2018, 11/06/2019,  11/02/2020   Moderna Covid-19 Vaccine Bivalent Booster 18yrs & up 06/14/2020, 11/26/2020   Moderna SARS-COV2 Booster Vaccination 06/14/2020   Moderna Sars-Covid-2 Vaccination 02/20/2019, 03/25/2019, 09/09/2019   PNEUMOCOCCAL CONJUGATE-20 04/11/2021   Pfizer(Comirnaty)Fall Seasonal Vaccine 12 years and older 11/23/2022   Tdap 08/29/2021   Zoster Recombinant(Shingrix) 08/16/2020, 02/08/2021   Health Maintenance Due  Topic Date Due   Colonoscopy  12/29/2020    Activities of Daily Living    11/23/2022    2:52 PM  In your present state of health, do you have any  difficulty performing the following activities:  Hearing? 0  Vision? 0  Difficulty concentrating or making decisions? 0  Walking or climbing stairs? 0  Dressing or bathing? 0  Doing errands, shopping? 0  Preparing Food and eating ? N  Using the Toilet? N  In the past six months, have you accidently leaked urine? N  Do you have problems with loss of bowel control? N  Managing your Medications? N  Managing your Finances? N  Housekeeping or managing your Housekeeping? N    Physical Exam   Physical Exam Vitals and nursing note reviewed.  Constitutional:      Appearance: Normal appearance. He is obese.  HENT:     Head: Normocephalic and atraumatic.     Right Ear: Tympanic membrane, ear canal and external ear normal.     Left Ear: Tympanic membrane, ear canal and external ear normal.  Eyes:     Extraocular Movements: Extraocular movements intact.     Pupils: Pupils are equal, round, and reactive to light.  Cardiovascular:     Rate and Rhythm: Normal rate and regular rhythm.     Pulses: Normal pulses.     Heart sounds: Normal heart sounds.  Pulmonary:     Effort: Pulmonary effort is normal.     Breath sounds: Normal breath sounds.  Abdominal:     General: Bowel sounds are normal.     Palpations: Abdomen is soft.  Genitourinary:    Comments: Deferred  Musculoskeletal:        General: Normal range of motion.     Cervical back: Normal range of motion.  Skin:    General: Skin is warm and dry.  Neurological:     General: No focal deficit present.     Mental Status: He is alert and oriented to person, place, and time.  Psychiatric:        Mood and Affect: Mood normal.       (optional), or other factors deemed appropriate based on the beneficiary's medical and social history and current clinical standards.   Advanced Directives: Does Patient Have a Medical Advance Directive?: No Would patient like information on creating a medical advance directive?: Yes  (MAU/Ambulatory/Procedural Areas - Information given)   EKG:  normal EKG, NSR w/ nonspecific T-abnormality     Assessment:    This is a routine wellness  examination for this patient .   Vision/Hearing screen Hearing Screening   500Hz  1000Hz  2000Hz  4000Hz   Right ear Pass Pass Pass Pass  Left ear Pass Pass Pass Pass   Vision Screening   Right eye Left eye Both eyes  Without correction 20/70 20/30 20/30   With correction        Goals       Cut out extra servings (pt-stated)      He would like to implement more exercise. Be more active. Along with portion control, how much he eats on the day to day basis.  Depression Screen    11/23/2022    2:48 PM 05/08/2022    8:49 AM 04/11/2021    8:42 AM 04/01/2020    8:37 AM  PHQ 2/9 Scores  PHQ - 2 Score 0 0 0 0  PHQ- 9 Score 0 0       Fall Risk    11/23/2022    2:48 PM  Fall Risk   Falls in the past year? 0  Number falls in past yr: 0  Injury with Fall? 0  Risk for fall due to : No Fall Risks  Follow up Falls evaluation completed    Cognitive Function        11/23/2022    2:58 PM  6CIT Screen  What Year? 0 points  What month? 0 points  What time? 0 points  Count back from 20 0 points  Months in reverse 0 points  Repeat phrase 2 points  Total Score 2 points    Patient Care Team: Dorothyann Peng, MD as PCP - General (Internal Medicine)     Plan:   Encounter for Medicare annual wellness exam Assessment & Plan: The annual wellness visit was performed including discussion of advanced directives, assessment of functional status and cognitive function. EKG performed, . A full exam was also performed. He will rto in one year for AWV with [facility] Advisor.  A full exam was performed.  DRE deferred. He is advised to get 30-45 minutes of regular exercise, no less than four to five days per week. Both weight-bearing and aerobic exercises are recommended.  He is advised to follow a healthy diet with at least six  fruits/veggies per day, decrease intake of red meat and other saturated fats and to increase fish intake to twice weekly.  Meats/fish should not be fried -- baked, boiled or broiled is preferable. It is also important to cut back on your sugar intake.  Be sure to read labels - try to avoid anything with added sugar, high fructose corn syrup or other sweeteners.  If you must use a sweetener, you can try stevia or monkfruit.  It is also important to avoid artificially sweetened foods/beverages and diet drinks. Lastly, wear SPF 50 sunscreen on exposed skin and when in direct sunlight for an extended period of time.  Be sure to avoid fast food restaurants and aim for at least 60 ounces of water daily.         Essential hypertension, malignant Assessment & Plan: Chronic, controlled. EKG performed, NSR w/ nonspecific T abnormality. He will continue with spironolactone 50mg  daily, olmesartan/amlodipine/hct 40/10/25mg  daily and carvedilol 12.5mg  twice daily. He is encouraged to follow low sodium diet. He will rto in four to six months for re-evaluation.   Orders: -     POCT urinalysis dipstick -     Microalbumin / creatinine urine ratio -     EKG 12-Lead -     CMP14+EGFR -     CBC -     TSH  Prediabetes Assessment & Plan: Previous labs reviewed, his A1c has been elevated in the past. I will check an A1c today. Reminded to avoid refined sugars including sugary drinks/foods and processed meats including bacon, sausages and deli meats.    Orders: -     CMP14+EGFR -     Hemoglobin A1c  Chronic gout involving toe of left foot without tophus, unspecified cause Assessment & Plan: Chronic, I will check uric acid level today. He is encouraged to stay well hydrated and avoid known  triggers.   Orders: -     Uric acid  Tinea versicolor Assessment & Plan: He is advised to use OTC Selsun Blue. He is advised to lather onto affected areas and rinse after ten minutes. Should use daily for up to ten days.  He should notice improvement in his condition at that time. If sx persist, he may need Dermatology evaluation.    Personal history of other colon polyps -     Ambulatory referral to Gastroenterology  Class 2 severe obesity due to excess calories with serious comorbidity and body mass index (BMI) of 35.0 to 35.9 in adult [facility]) Assessment & Plan: He is encouraged to initially strive for BMI less than 30 to decrease cardiac risk. He is advised to exercise no less than 150 minutes per week.     Immunization due -     Pfizer Comirnaty Covid-19 Vaccine 1yrs & older -     Flu Vaccine Trivalent High Dose (Fluad)  Other orders -     Olmesartan-amLODIPine-HCTZ; TAKE (1) TABLET BY MOUTH EACH MORNING.  Dispense: 90 tablet; Refill: 2 -     Spironolactone; Take 1 tablet (50 mg total) by mouth daily.  Dispense: 90 tablet; Refill: 2 -     Allopurinol; Take 1 tablet (100 mg total) by mouth daily.  Dispense: 90 tablet; Refill: 2 -     Carvedilol; Take 1 tablet (12.5 mg total) by mouth 2 (two) times daily with a meal.  Dispense: 180 tablet; Refill: 2 -     Colchicine; Take 1 tablet (0.6 mg total) by mouth daily as needed.  Dispense: 30 tablet; Refill: 0     I have personally reviewed and noted the following in the patient's chart:   Medical and social history Use of alcohol, tobacco or illicit drugs  Current medications and supplements Functional ability and status Nutritional status Physical activity Advanced directives List of other physicians Hospitalizations, surgeries, and ER visits in previous 12 months Vitals Screenings to include cognitive, depression, and falls Referrals and appointments  In addition, I have reviewed and discussed with patient certain preventive protocols, quality metrics, and best practice recommendations. A written personalized care plan for preventive services as well as general preventive health recommendations were provided to patient.     Gwynneth Aliment,  MD 11/28/2022

## 2022-11-24 LAB — CBC
Hematocrit: 41.5 % (ref 37.5–51.0)
Hemoglobin: 13.9 g/dL (ref 13.0–17.7)
MCH: 30.2 pg (ref 26.6–33.0)
MCHC: 33.5 g/dL (ref 31.5–35.7)
MCV: 90 fL (ref 79–97)
Platelets: 249 10*3/uL (ref 150–450)
RBC: 4.6 x10E6/uL (ref 4.14–5.80)
RDW: 12.8 % (ref 11.6–15.4)
WBC: 6.5 10*3/uL (ref 3.4–10.8)

## 2022-11-24 LAB — CMP14+EGFR
ALT: 26 [IU]/L (ref 0–44)
AST: 23 [IU]/L (ref 0–40)
Albumin: 4.5 g/dL (ref 3.9–4.9)
Alkaline Phosphatase: 80 [IU]/L (ref 44–121)
BUN/Creatinine Ratio: 24 (ref 10–24)
BUN: 25 mg/dL (ref 8–27)
Bilirubin Total: 0.8 mg/dL (ref 0.0–1.2)
CO2: 25 mmol/L (ref 20–29)
Calcium: 9.9 mg/dL (ref 8.6–10.2)
Chloride: 100 mmol/L (ref 96–106)
Creatinine, Ser: 1.05 mg/dL (ref 0.76–1.27)
Globulin, Total: 2.8 g/dL (ref 1.5–4.5)
Glucose: 93 mg/dL (ref 70–99)
Potassium: 3.7 mmol/L (ref 3.5–5.2)
Sodium: 140 mmol/L (ref 134–144)
Total Protein: 7.3 g/dL (ref 6.0–8.5)
eGFR: 78 mL/min/{1.73_m2} (ref >59)

## 2022-11-24 LAB — HEMOGLOBIN A1C
Est. average glucose Bld gHb Est-mCnc: 126 mg/dL
Hgb A1c MFr Bld: 6 % — ABNORMAL HIGH (ref 4.8–5.6)

## 2022-11-24 LAB — MICROALBUMIN / CREATININE URINE RATIO
Creatinine, Urine: 136.6 mg/dL
Microalb/Creat Ratio: 2 mg/g{creat} (ref 0–29)
Microalbumin, Urine: 3.3 ug/mL

## 2022-11-24 LAB — URIC ACID: Uric Acid: 5.4 mg/dL (ref 3.8–8.4)

## 2022-11-24 LAB — TSH: TSH: 1.29 u[IU]/mL (ref 0.450–4.500)

## 2022-11-28 DIAGNOSIS — M1A9XX0 Chronic gout, unspecified, without tophus (tophi): Secondary | ICD-10-CM | POA: Insufficient documentation

## 2022-11-28 NOTE — Assessment & Plan Note (Signed)
Previous labs reviewed, his A1c has been elevated in the past. I will check an A1c today. Reminded to avoid refined sugars including sugary drinks/foods and processed meats including bacon, sausages and deli meats.     

## 2022-11-28 NOTE — Assessment & Plan Note (Signed)
The annual wellness visit was performed including discussion of advanced directives, assessment of functional status and cognitive function. EKG performed, . A full exam was also performed. He will rto in one year for AWV with [facility] Advisor.  A full exam was performed.  DRE deferred. He is advised to get 30-45 minutes of regular exercise, no less than four to five days per week. Both weight-bearing and aerobic exercises are recommended.  He is advised to follow a healthy diet with at least six fruits/veggies per day, decrease intake of red meat and other saturated fats and to increase fish intake to twice weekly.  Meats/fish should not be fried -- baked, boiled or broiled is preferable. It is also important to cut back on your sugar intake.  Be sure to read labels - try to avoid anything with added sugar, high fructose corn syrup or other sweeteners.  If you must use a sweetener, you can try stevia or monkfruit.  It is also important to avoid artificially sweetened foods/beverages and diet drinks. Lastly, wear SPF 50 sunscreen on exposed skin and when in direct sunlight for an extended period of time.  Be sure to avoid fast food restaurants and aim for at least 60 ounces of water daily.

## 2022-11-28 NOTE — Assessment & Plan Note (Signed)
He is advised to use OTC Selsun Blue. He is advised to lather onto affected areas and rinse after ten minutes. Should use daily for up to ten days. He should notice improvement in his condition at that time. If sx persist, he may need Dermatology evaluation.

## 2022-11-28 NOTE — Assessment & Plan Note (Signed)
He is encouraged to initially strive for BMI less than 30 to decrease cardiac risk. He is advised to exercise no less than 150 minutes per week.

## 2022-11-28 NOTE — Assessment & Plan Note (Signed)
Chronic, controlled. EKG performed, NSR w/ nonspecific T abnormality. He will continue with spironolactone 50mg  daily, olmesartan/amlodipine/hct 40/10/25mg  daily and carvedilol 12.5mg  twice daily. He is encouraged to follow low sodium diet. He will rto in four to six months for re-evaluation.

## 2022-11-28 NOTE — Assessment & Plan Note (Signed)
Chronic, I will check uric acid level today. He is encouraged to stay well hydrated and avoid known triggers.

## 2023-01-30 LAB — HM COLONOSCOPY

## 2023-05-22 ENCOUNTER — Ambulatory Visit (INDEPENDENT_AMBULATORY_CARE_PROVIDER_SITE_OTHER): Payer: Medicare Other | Admitting: Internal Medicine

## 2023-05-22 ENCOUNTER — Encounter: Payer: Self-pay | Admitting: Internal Medicine

## 2023-05-22 VITALS — BP 100/60 | HR 86 | Temp 98.5°F | Ht 65.0 in | Wt 216.8 lb

## 2023-05-22 DIAGNOSIS — Z8739 Personal history of other diseases of the musculoskeletal system and connective tissue: Secondary | ICD-10-CM

## 2023-05-22 DIAGNOSIS — Z6836 Body mass index (BMI) 36.0-36.9, adult: Secondary | ICD-10-CM

## 2023-05-22 DIAGNOSIS — E66812 Obesity, class 2: Secondary | ICD-10-CM

## 2023-05-22 DIAGNOSIS — D1801 Hemangioma of skin and subcutaneous tissue: Secondary | ICD-10-CM

## 2023-05-22 DIAGNOSIS — M25551 Pain in right hip: Secondary | ICD-10-CM | POA: Diagnosis not present

## 2023-05-22 DIAGNOSIS — R351 Nocturia: Secondary | ICD-10-CM | POA: Insufficient documentation

## 2023-05-22 DIAGNOSIS — R7303 Prediabetes: Secondary | ICD-10-CM

## 2023-05-22 DIAGNOSIS — I10 Essential (primary) hypertension: Secondary | ICD-10-CM

## 2023-05-22 LAB — POCT URINALYSIS DIPSTICK
Bilirubin, UA: NEGATIVE
Blood, UA: NEGATIVE
Glucose, UA: NEGATIVE
Leukocytes, UA: NEGATIVE
Nitrite, UA: NEGATIVE
Protein, UA: NEGATIVE
Spec Grav, UA: 1.02 (ref 1.010–1.025)
Urobilinogen, UA: 1 U/dL
pH, UA: 5.5 (ref 5.0–8.0)

## 2023-05-22 NOTE — Progress Notes (Signed)
 I,Jameka J Llittleton, CMA,acting as a Neurosurgeon for Smiley Dung, MD.,have documented all relevant documentation on the behalf of Smiley Dung, MD,as directed by  Smiley Dung, MD while in the presence of Smiley Dung, MD.  Subjective:  Patient ID: Jerome Pacheco , male    DOB: Dec 02, 1955 , 68 y.o.   MRN: [id number]  Chief Complaint  Patient presents with   Hypertension    Patient presents today for a bpc. Patient reports compliance with his meds. Patient denies having chest pain, sob or headaches at this time.    Rash    Patient reports he has these small little "blood spots" all over he would like for you to look at. Patient reports they have been there for a while and he didn't think anything of it but his wife wanted him to ask you.     HPI Discussed the use of AI scribe software for clinical note transcription with the patient, who gave verbal consent to proceed.  History of Present Illness Jerome Pacheco is a 68 year old male who presents for a blood pressure check and evaluation of a skin rash.  He has a skin rash characterized by small blotches that appear intermittently on various parts of his body, including his chest. These blotches resemble 'little blood,' are not painful, and do not itch. They appear to 'pop out of a pore' and then disappear. His wife expressed concern about these blotches, prompting him to seek medical advice.  He is here for a routine blood pressure check. He has been managing his blood pressure with lifestyle changes, including not adding salt to his food. He believes his retirement has reduced his stress levels, contributing to some improvement in his blood pressure.  He typically drinks two bottles of water a day, aiming for three. His exercise routine includes playing golf and occasional workouts, though he admits inconsistency. He experiences exhaustion after playing golf, affecting his ability to exercise the following day.  His sleep schedule  involves waking up early, typically between 3 and 4 AM, and going to bed between 8:30 and 9 PM. He occasionally stays up later if watching a program.  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 He was diagnosed with Paget's and treated with infusion therapy.  He experiences some right hip pain after playing golf but not during normal activities.   He denies smoking and has no exposure to smoke. He does most of the cooking at home and maintains a diet that includes a variety of foods, such as baked chicken, steak, broccoli, and sweet potatoes.   Hypertension This is a chronic problem. The current episode started more than 1 year ago. The problem has been gradually improving since onset. The problem is controlled. Pertinent negatives include no blurred vision, chest pain, palpitations or shortness of breath. Anxiety: ekg.Risk factors for coronary artery disease include male gender and obesity.     Past Medical History:  Diagnosis Date   Hypertension    Sleep apnea      Family History  Problem Relation Age of Onset   Heart disease Father    Cancer Mother      Current Outpatient Medications:    allopurinol  (ZYLOPRIM ) 100 MG tablet, Take 1 tablet (100 mg total) by mouth daily., Disp: 90 tablet, Rfl: 2   carvedilol  (COREG ) 12.5 MG tablet, Take 1 tablet (12.5 mg total) by mouth 2 (two) times daily with a  meal., Disp: 180 tablet, Rfl: 2   colchicine  0.6 MG tablet, Take 1 tablet (0.6 mg total) by mouth daily as needed., Disp: 30 tablet, Rfl: 0   ibuprofen  (ADVIL ) 600 MG tablet, Take 1 tablet (600 mg total) by mouth every 6 (six) hours as needed., Disp: 30 tablet, Rfl: 0   Olmesartan -amLODIPine -HCTZ 40-10-25 MG TABS, TAKE (1) TABLET BY MOUTH EACH MORNING., Disp: 90 tablet, Rfl: 2   spironolactone  (ALDACTONE ) 50 MG tablet, Take 1 tablet (50 mg total) by mouth daily., Disp: 90 tablet, Rfl: 2   No Known Allergies   Review of Systems   Constitutional: Negative.   Eyes:  Negative for blurred vision.  Respiratory: Negative.  Negative for shortness of breath.   Cardiovascular: Negative.  Negative for chest pain and palpitations.  Gastrointestinal: Negative.   Skin:  Positive for rash.  Neurological: Negative.   Psychiatric/Behavioral: Negative.       Today's Vitals   05/22/23 1023  BP: 100/60  Pulse: 86  Temp: 98.5 F (36.9 C)  TempSrc: Oral  Weight: 216 lb 12.8 oz (98.3 kg)  Height: 5\' 5"  (1.651 m)  PainSc: 0-No pain   Body mass index is 36.08 kg/m.  Wt Readings from Last 3 Encounters:  05/22/23 216 lb 12.8 oz (98.3 kg)  11/23/22 216 lb 3.2 oz (98.1 kg)  05/08/22 220 lb 3.2 oz (99.9 kg)     Objective:  Physical Exam Vitals and nursing note reviewed.  Constitutional:      Appearance: Normal appearance. He is obese.  HENT:     Head: Normocephalic and atraumatic.  Eyes:     Extraocular Movements: Extraocular movements intact.  Cardiovascular:     Rate and Rhythm: Normal rate and regular rhythm.     Heart sounds: Normal heart sounds.  Pulmonary:     Effort: Pulmonary effort is normal.     Breath sounds: Normal breath sounds.  Musculoskeletal:     Cervical back: Normal range of motion.  Skin:    General: Skin is warm.  Neurological:     General: No focal deficit present.     Mental Status: He is alert.  Psychiatric:        Mood and Affect: Mood normal.          Assessment And Plan:  Essential hypertension, malignant Assessment & Plan: Chronic, controlled. He will continue with spironolactone  50mg  daily, olmesartan /amlodipine /hct 40/10/25mg  daily and carvedilol  12.5mg  twice daily. He is encouraged to follow low sodium diet. He will rto in four to six months for re-evaluation.   Orders: -     POCT urinalysis dipstick -     Microalbumin / creatinine urine ratio -     CMP14+EGFR  Prediabetes Assessment & Plan: Previous labs reviewed, his A1c has been elevated in the past. I will check an  A1c today. Reminded to avoid refined sugars including sugary drinks/foods and processed meats including bacon, sausages and deli meats.    Orders: -     CMP14+EGFR -     Hemoglobin A1c  Cherry angioma Assessment & Plan: Intermittent, non-painful, non-pruritic lesions not limited to sun-exposed areas. Dermatologist referral not needed. -Lesions appear to be cherry angiomas    Right hip pain Assessment & Plan: Chronic, intermittent.  -Consider repeat imaging studies at future visit   Nocturia -     PSA Total (Reflex To Free)  Class 2 severe obesity due to excess calories with serious comorbidity and body mass index (BMI) of 36.0 to 36.9 in adult [facility])  Assessment & Plan: He is encouraged to initially strive for BMI less than 30 to decrease cardiac risk. He is advised to exercise no less than 150 minutes per week.     Personal history of Paget's disease of bone Assessment & Plan: He has had right hip sclerosis, asymptomatic except possible arthritis pain post-golfing. Previous blood work normal. - Order blood work to assess current status. - Access old records for evaluation. - Determine need for imaging based on records and symptoms.    Return if symptoms worsen or fail to improve.  Patient was given opportunity to ask questions. Patient verbalized understanding of the plan and was able to repeat key elements of the plan. All questions were answered to their satisfaction.    I, Smiley Dung, MD, have reviewed all documentation for this visit. The documentation on 05/22/23 for the exam, diagnosis, procedures, and orders are all accurate and complete.   IF YOU HAVE BEEN REFERRED TO A SPECIALIST, IT MAY TAKE 1-2 WEEKS TO SCHEDULE/PROCESS THE REFERRAL. IF YOU HAVE NOT HEARD FROM US /SPECIALIST IN TWO WEEKS, PLEASE GIVE US  A CALL AT [phone] X [phone].

## 2023-05-22 NOTE — Patient Instructions (Signed)
 Hypertension, Adult Hypertension is another name for high blood pressure. High blood pressure forces your heart to work harder to pump blood. This can cause problems over time. There are two numbers in a blood pressure reading. There is a top number (systolic) over a bottom number (diastolic). It is best to have a blood pressure that is below 120/80. What are the causes? The cause of this condition is not known. Some other conditions can lead to high blood pressure. What increases the risk? Some lifestyle factors can make you more likely to develop high blood pressure: Smoking. Not getting enough exercise or physical activity. Being overweight. Having too much fat, sugar, calories, or salt (sodium) in your diet. Drinking too much alcohol. Other risk factors include: Having any of these conditions: Heart disease. Diabetes. High cholesterol. Kidney disease. Obstructive sleep apnea. Having a family history of high blood pressure and high cholesterol. Age. The risk increases with age. Stress. What are the signs or symptoms? High blood pressure may not cause symptoms. Very high blood pressure (hypertensive crisis) may cause: Headache. Fast or uneven heartbeats (palpitations). Shortness of breath. Nosebleed. Vomiting or feeling like you may vomit (nauseous). Changes in how you see. Very bad chest pain. Feeling dizzy. Seizures. How is this treated? This condition is treated by making healthy lifestyle changes, such as: Eating healthy foods. Exercising more. Drinking less alcohol. Your doctor may prescribe medicine if lifestyle changes do not help enough and if: Your top number is above 130. Your bottom number is above 80. Your personal target blood pressure may vary. Follow these instructions at home: Eating and drinking  If told, follow the DASH eating plan. To follow this plan: Fill one half of your plate at each meal with fruits and vegetables. Fill one fourth of your plate  at each meal with whole grains. Whole grains include whole-wheat pasta, brown rice, and whole-grain bread. Eat or drink low-fat dairy products, such as skim milk or low-fat yogurt. Fill one fourth of your plate at each meal with low-fat (lean) proteins. Low-fat proteins include fish, chicken without skin, eggs, beans, and tofu. Avoid fatty meat, cured and processed meat, or chicken with skin. Avoid pre-made or processed food. Limit the amount of salt in your diet to less than 1,500 mg each day. Do not drink alcohol if: Your doctor tells you not to drink. You are pregnant, may be pregnant, or are planning to become pregnant. If you drink alcohol: Limit how much you have to: 0-1 drink a day for women. 0-2 drinks a day for men. Know how much alcohol is in your drink. In the [place] one drink equals one 12 oz bottle of beer (355 mL), one 5 oz glass of wine (148 mL), or one 1 oz glass of hard liquor (44 mL). Lifestyle  Work with your doctor to stay at a healthy weight or to lose weight. Ask your doctor what the best weight is for you. Get at least 30 minutes of exercise that causes your heart to beat faster (aerobic exercise) most days of the week. This may include walking, swimming, or biking. Get at least 30 minutes of exercise that strengthens your muscles (resistance exercise) at least 3 days a week. This may include lifting weights or doing Pilates. Do not smoke or use any products that contain nicotine or tobacco. If you need help quitting, ask your doctor. Check your blood pressure at home as told by your doctor. Keep all follow-up visits. Medicines Take over-the-counter and prescription medicines  only as told by your doctor. Follow directions carefully. Do not skip doses of blood pressure medicine. The medicine does not work as well if you skip doses. Skipping doses also puts you at risk for problems. Ask your doctor about side effects or reactions to medicines that you should watch  for. Contact a doctor if: You think you are having a reaction to the medicine you are taking. You have headaches that keep coming back. You feel dizzy. You have swelling in your ankles. You have trouble with your vision. Get help right away if: You get a very bad headache. You start to feel mixed up (confused). You feel weak or numb. You feel faint. You have very bad pain in your: Chest. Belly (abdomen). You vomit more than once. You have trouble breathing. These symptoms may be an emergency. Get help right away. Call 911. Do not wait to see if the symptoms will go away. Do not drive yourself to the hospital. Summary Hypertension is another name for high blood pressure. High blood pressure forces your heart to work harder to pump blood. For most people, a normal blood pressure is less than 120/80. Making healthy choices can help lower blood pressure. If your blood pressure does not get lower with healthy choices, you may need to take medicine. This information is not intended to replace advice given to you by your health care provider. Make sure you discuss any questions you have with your health care provider. Document Revised: 10/14/2020 Document Reviewed: 10/14/2020 Elsevier Patient Education  2024 [employer].

## 2023-05-23 LAB — MICROALBUMIN / CREATININE URINE RATIO
Creatinine, Urine: 192.6 mg/dL
Microalb/Creat Ratio: 2 mg/g{creat} (ref 0–29)
Microalbumin, Urine: 3.8 ug/mL

## 2023-05-23 LAB — CMP14+EGFR
ALT: 28 IU/L (ref 0–44)
AST: 20 IU/L (ref 0–40)
Albumin: 4.7 g/dL (ref 3.9–4.9)
Alkaline Phosphatase: 78 IU/L (ref 44–121)
BUN/Creatinine Ratio: 19 (ref 10–24)
BUN: 22 mg/dL (ref 8–27)
Bilirubin Total: 0.9 mg/dL (ref 0.0–1.2)
CO2: 23 mmol/L (ref 20–29)
Calcium: 10.3 mg/dL — ABNORMAL HIGH (ref 8.6–10.2)
Chloride: 104 mmol/L (ref 96–106)
Creatinine, Ser: 1.17 mg/dL (ref 0.76–1.27)
Globulin, Total: 2.6 g/dL (ref 1.5–4.5)
Glucose: 120 mg/dL — ABNORMAL HIGH (ref 70–99)
Potassium: 3.8 mmol/L (ref 3.5–5.2)
Sodium: 143 mmol/L (ref 134–144)
Total Protein: 7.3 g/dL (ref 6.0–8.5)
eGFR: 68 mL/min/{1.73_m2} (ref >59)

## 2023-05-23 LAB — PSA TOTAL (REFLEX TO FREE): Prostate Specific Ag, Serum: 0.8 ng/mL (ref 0.0–4.0)

## 2023-05-23 LAB — HEMOGLOBIN A1C
Est. average glucose Bld gHb Est-mCnc: 134 mg/dL
Hgb A1c MFr Bld: 6.3 % — ABNORMAL HIGH (ref 4.8–5.6)

## 2023-05-27 ENCOUNTER — Ambulatory Visit: Payer: Self-pay | Admitting: Internal Medicine

## 2023-06-02 DIAGNOSIS — D1801 Hemangioma of skin and subcutaneous tissue: Secondary | ICD-10-CM | POA: Insufficient documentation

## 2023-06-02 DIAGNOSIS — Z8739 Personal history of other diseases of the musculoskeletal system and connective tissue: Secondary | ICD-10-CM | POA: Insufficient documentation

## 2023-06-02 DIAGNOSIS — M25551 Pain in right hip: Secondary | ICD-10-CM | POA: Insufficient documentation

## 2023-06-02 NOTE — Assessment & Plan Note (Signed)
 Chronic, intermittent.  -Consider repeat imaging studies at future visit

## 2023-06-02 NOTE — Assessment & Plan Note (Signed)
 Intermittent, non-painful, non-pruritic lesions not limited to sun-exposed areas. Dermatologist referral not needed. -Lesions appear to be cherry angiomas

## 2023-06-02 NOTE — Assessment & Plan Note (Signed)
 He is encouraged to initially strive for BMI less than 30 to decrease cardiac risk. He is advised to exercise no less than 150 minutes per week.

## 2023-06-02 NOTE — Assessment & Plan Note (Signed)
 Previous labs reviewed, his A1c has been elevated in the past. I will check an A1c today. Reminded to avoid refined sugars including sugary drinks/foods and processed meats including bacon, sausages and deli meats.

## 2023-06-02 NOTE — Assessment & Plan Note (Signed)
 Chronic, controlled. He will continue with spironolactone  50mg  daily, olmesartan /amlodipine /hct 40/10/25mg  daily and carvedilol  12.5mg  twice daily. He is encouraged to follow low sodium diet. He will rto in four to six months for re-evaluation.

## 2023-06-02 NOTE — Assessment & Plan Note (Signed)
 He has had right hip sclerosis, asymptomatic except possible arthritis pain post-golfing. Previous blood work normal. - Order blood work to assess current status. - Access old records for evaluation. - Determine need for imaging based on records and symptoms.

## 2023-08-14 ENCOUNTER — Other Ambulatory Visit: Payer: Self-pay | Admitting: Internal Medicine

## 2023-09-17 ENCOUNTER — Other Ambulatory Visit: Payer: Self-pay | Admitting: Internal Medicine

## 2023-09-19 ENCOUNTER — Other Ambulatory Visit: Payer: Self-pay

## 2023-09-19 DIAGNOSIS — I10 Essential (primary) hypertension: Secondary | ICD-10-CM

## 2023-09-19 MED ORDER — OLMESARTAN-AMLODIPINE-HCTZ 40-10-25 MG PO TABS: 1.0000 | ORAL_TABLET | Freq: Every morning | ORAL | 2 refills | Status: DC

## 2023-09-27 ENCOUNTER — Other Ambulatory Visit: Payer: Self-pay

## 2023-09-27 DIAGNOSIS — I10 Essential (primary) hypertension: Secondary | ICD-10-CM

## 2023-09-27 MED ORDER — OLMESARTAN-AMLODIPINE-HCTZ 40-10-25 MG PO TABS: 1.0000 | ORAL_TABLET | Freq: Every morning | ORAL | 2 refills | Status: DC

## 2023-10-30 ENCOUNTER — Other Ambulatory Visit: Payer: Self-pay

## 2023-10-30 DIAGNOSIS — I10 Essential (primary) hypertension: Secondary | ICD-10-CM

## 2023-10-30 MED ORDER — ALLOPURINOL 100 MG PO TABS: 100.0000 mg | ORAL_TABLET | Freq: Every day | ORAL | 2 refills | Status: AC

## 2023-10-30 MED ORDER — SPIRONOLACTONE 50 MG PO TABS: 50.0000 mg | ORAL_TABLET | Freq: Every day | ORAL | 2 refills | Status: AC

## 2023-10-30 MED ORDER — CARVEDILOL 12.5 MG PO TABS: 12.5000 mg | ORAL_TABLET | Freq: Two times a day (BID) | ORAL | 2 refills | Status: AC

## 2023-10-30 MED ORDER — OLMESARTAN-AMLODIPINE-HCTZ 40-10-25 MG PO TABS: 1.0000 | ORAL_TABLET | Freq: Every morning | ORAL | 2 refills | Status: AC

## 2023-11-05 ENCOUNTER — Encounter: Payer: Self-pay | Admitting: Internal Medicine

## 2023-11-09 ENCOUNTER — Ambulatory Visit: Payer: Self-pay

## 2023-11-13 ENCOUNTER — Ambulatory Visit: Payer: Self-pay

## 2023-11-13 VITALS — BP 110/80 | HR 75 | Temp 98.2°F | Ht 65.0 in | Wt 216.0 lb

## 2023-11-13 DIAGNOSIS — Z23 Encounter for immunization: Secondary | ICD-10-CM | POA: Diagnosis not present

## 2023-11-13 NOTE — Progress Notes (Signed)
 Patient presents today for a flu vaccine. Patient received vaccine in his left deltoid. Patient tolerated vaccine well. YL,RMA

## 2023-11-15 NOTE — Addendum Note (Signed)
 Addended by: CRAVEN ADELIA PARAS on: 11/15/2023 02:09 PM   Modules accepted: Orders

## 2023-12-10 ENCOUNTER — Other Ambulatory Visit: Payer: Self-pay | Admitting: Internal Medicine

## 2023-12-10 ENCOUNTER — Encounter: Payer: Self-pay | Admitting: Internal Medicine

## 2023-12-10 ENCOUNTER — Ambulatory Visit: Payer: Self-pay | Admitting: Internal Medicine

## 2023-12-10 VITALS — BP 110/80 | HR 77 | Temp 98.1°F | Ht 65.0 in | Wt 219.6 lb

## 2023-12-10 DIAGNOSIS — R351 Nocturia: Secondary | ICD-10-CM

## 2023-12-10 DIAGNOSIS — R7303 Prediabetes: Secondary | ICD-10-CM | POA: Diagnosis not present

## 2023-12-10 DIAGNOSIS — Z0000 Encounter for general adult medical examination without abnormal findings: Secondary | ICD-10-CM | POA: Insufficient documentation

## 2023-12-10 DIAGNOSIS — I10 Essential (primary) hypertension: Secondary | ICD-10-CM

## 2023-12-10 LAB — POCT URINALYSIS DIPSTICK
Bilirubin, UA: NEGATIVE
Blood, UA: NEGATIVE
Glucose, UA: NEGATIVE
Ketones, UA: NEGATIVE
Leukocytes, UA: NEGATIVE
Nitrite, UA: NEGATIVE
Protein, UA: NEGATIVE
Spec Grav, UA: 1.02 (ref 1.010–1.025)
Urobilinogen, UA: 0.2 U/dL
pH, UA: 5.5 (ref 5.0–8.0)

## 2023-12-10 LAB — POC HEMOCCULT BLD/STL (OFFICE/1-CARD/DIAGNOSTIC): Fecal Occult Blood, POC: NEGATIVE

## 2023-12-10 NOTE — Progress Notes (Signed)
 I,Jerome Pacheco, CMA,acting as a neurosurgeon for Jerome LOISE Slocumb, MD.,have documented all relevant documentation on the behalf of Jerome LOISE Slocumb, MD,as directed by  Jerome LOISE Slocumb, MD while in the presence of Jerome LOISE Slocumb, MD.  Subjective:   Patient ID: Jerome Pacheco , male    DOB: [birth year] , 68 y.o.   MRN: [id number]  Chief Complaint  Patient presents with   Annual Exam    Patient presents today for annual exam. He reports compliance with medication. Denies headache, chest pain & sob.    Hypertension   Prediabetes    HPI Discussed the use of AI scribe software for clinical note transcription with the patient, who gave verbal consent to proceed.  History of Present Illness Jerome Pacheco is a 68 year old male who presents for an annual physical exam.  He experiences nocturia, waking up once per night to urinate. No blood in his stool and he has daily bowel movements.  He reports a gap in his carvedilol  medication refills between May and October, but states he is currently taking it daily. He has a surplus of the medication due to multiple orders and is not taking it at night as prescribed. He is also taking allopurinol .  He has not been exercising recently due to personal commitments, including a trip to [place] for his [place] and preparing for holiday decorations.  No dizziness and no blood in his stool.   Hypertension This is a chronic problem. The current episode started more than 1 year ago. The problem has been gradually improving since onset. The problem is controlled. Pertinent negatives include no blurred vision, chest pain, palpitations or shortness of breath. Anxiety: ekg.Risk factors for coronary artery disease include male gender and obesity.     Past Medical History:  Diagnosis Date   Hypertension    Sleep apnea      Family History  Problem Relation Age of Onset   Heart disease Father    Cancer Mother      Current Outpatient Medications:     allopurinol  (ZYLOPRIM ) 100 MG tablet, Take 1 tablet (100 mg total) by mouth daily., Disp: 90 tablet, Rfl: 2   carvedilol  (COREG ) 12.5 MG tablet, Take 1 tablet (12.5 mg total) by mouth 2 (two) times daily with a meal., Disp: 180 tablet, Rfl: 2   colchicine  0.6 MG tablet, Take 1 tablet (0.6 mg total) by mouth daily as needed., Disp: 30 tablet, Rfl: 0   ibuprofen  (ADVIL ) 600 MG tablet, Take 1 tablet (600 mg total) by mouth every 6 (six) hours as needed., Disp: 30 tablet, Rfl: 0   Olmesartan -amLODIPine -HCTZ 40-10-25 MG TABS, Take 1 tablet by mouth every morning., Disp: 90 tablet, Rfl: 2   spironolactone  (ALDACTONE ) 50 MG tablet, Take 1 tablet (50 mg total) by mouth daily., Disp: 90 tablet, Rfl: 2   No Known Allergies   Men's preventive visit. Patient Health Questionnaire (PHQ-2) is  Flowsheet Row Office Visit from 12/10/2023 in [facility]  PHQ-2 Total Score 0  . Patient is on a regular diet. Marital status: Married. Relevant history for alcohol use is:  Social History   Substance and Sexual Activity  Alcohol Use No   Comment: quit 2012  . Relevant history for tobacco use is:  Social History   Tobacco Use  Smoking Status Never  Smokeless Tobacco Never  .   Review of Systems  Constitutional: Negative.   HENT: Negative.    Eyes: Negative.  Negative  for blurred vision.  Respiratory: Negative.  Negative for shortness of breath.   Cardiovascular: Negative.  Negative for chest pain and palpitations.  Gastrointestinal: Negative.   Endocrine: Negative.   Genitourinary: Negative.   Musculoskeletal: Negative.   Skin: Negative.   Allergic/Immunologic: Negative.   Neurological: Negative.   Hematological: Negative.   Psychiatric/Behavioral: Negative.       Today's Vitals   12/10/23 0858  BP: 110/80  Pulse: 77  Temp: 98.1 F (36.7 C)  SpO2: 98%  Weight: 219 lb 9.6 oz (99.6 kg)  Height: 5' 5 (1.651 m)   Body mass index is 36.54 kg/m.  Wt Readings  from Last 3 Encounters:  12/10/23 219 lb 9.6 oz (99.6 kg)  11/13/23 216 lb (98 kg)  05/22/23 216 lb 12.8 oz (98.3 kg)    Objective:  Physical Exam Vitals and nursing note reviewed. Exam conducted with a chaperone present.  Constitutional:      Appearance: Normal appearance.  HENT:     Head: Normocephalic and atraumatic.     Right Ear: Tympanic membrane, ear canal and external ear normal.     Left Ear: Tympanic membrane, ear canal and external ear normal.     Nose: Nose normal.     Mouth/Throat:     Mouth: Mucous membranes are moist.     Pharynx: Oropharynx is clear.  Eyes:     Extraocular Movements: Extraocular movements intact.     Conjunctiva/sclera: Conjunctivae normal.     Pupils: Pupils are equal, round, and reactive to light.  Cardiovascular:     Rate and Rhythm: Normal rate and regular rhythm.     Pulses: Normal pulses.     Heart sounds: Normal heart sounds.  Pulmonary:     Effort: Pulmonary effort is normal.     Breath sounds: Normal breath sounds.  Chest:  Breasts:    Right: Normal. No swelling, bleeding, inverted nipple, mass or nipple discharge.     Left: Normal. No swelling, bleeding, inverted nipple, mass or nipple discharge.  Abdominal:     General: Bowel sounds are normal.     Palpations: Abdomen is soft.     Comments: Rounded, soft  Genitourinary:    Prostate: Enlarged.     Rectum: Normal. Guaiac result negative.     Comments: Enlarged prostate Musculoskeletal:        General: Normal range of motion.     Cervical back: Normal range of motion and neck supple.  Skin:    General: Skin is warm.  Neurological:     General: No focal deficit present.     Mental Status: He is alert.  Psychiatric:        Mood and Affect: Mood normal.        Behavior: Behavior normal.         Assessment And Plan:    Encounter for annual health examination Assessment & Plan: A full exam was performed.  DRE performed, stool is heme negative.  He is advised to get 30-45  minutes of regular exercise, no less than four to five days per week. Both weight-bearing and aerobic exercises are recommended.  He is advised to follow a healthy diet with at least six fruits/veggies per day, decrease intake of red meat and other saturated fats and to increase fish intake to twice weekly.  Meats/fish should not be fried -- baked, boiled or broiled is preferable. It is also important to cut back on your sugar intake.  Be sure to read labels -  try to avoid anything with added sugar, high fructose corn syrup or other sweeteners.  If you must use a sweetener, you can try stevia or monkfruit.  It is also important to avoid artificially sweetened foods/beverages and diet drinks. Lastly, wear SPF 50 sunscreen on exposed skin and when in direct sunlight for an extended period of time.  Be sure to avoid fast food restaurants and aim for at least 60 ounces of water daily.      Orders: -     POC Hemoccult Bld/Stl (1-Cd Office Dx)  Essential hypertension, malignant Assessment & Plan: Chronic, fair control. Goal BP<130/80.  EKG performed, NSR w/ nonspecific T abnormality - no new changes.. Hypertension managed with olmestartan/amlodipine /hydrochlorothiazide , spironolactone  and carvedilol . Non-adherence to nighttime dosing due to low diastolic pressure. - Advised nighttime dosing of carvedilol . - Follow low sodium diet.  - Increase daily activity.  Orders: -     POCT urinalysis dipstick -     CMP14+EGFR -     Lipid panel -     Hemoglobin A1c -     CBC -     EKG 12-Lead -     Microalbumin / creatinine urine ratio  Prediabetes Assessment & Plan: Previous labs reviewed, his A1c has been elevated in the past. I will check an A1c today. Reminded to avoid refined sugars including sugary drinks/foods and processed meats including bacon, sausages and deli meats.    Orders: -     CMP14+EGFR -     Hemoglobin A1c  Nocturia Assessment & Plan: Wakes once per night to urinate without  hematuria or other symptoms.  Orders: -     PSA  Morbid obesity due to excess calories (HCC) Assessment & Plan: BMI 36 w/ HTN. He is encouraged to strive for BMI less than 30 to decrease cardiac risk. Advised to aim for at least 150 minutes of exercise per week.     Return for 1 year physical, 6 month bp. Patient was given opportunity to ask questions. Patient verbalized understanding of the plan and was able to repeat key elements of the plan. All questions were answered to their satisfaction.   I, Jerome LOISE Slocumb, MD, have reviewed all documentation for this visit. The documentation on 12/10/23 for the exam, diagnosis, procedures, and orders are all accurate and complete.

## 2023-12-10 NOTE — Assessment & Plan Note (Signed)
 Chronic, fair control. Goal BP<130/80.  EKG performed, NSR w/ nonspecific T abnormality - no new changes.. Hypertension managed with olmestartan/amlodipine /hydrochlorothiazide , spironolactone  and carvedilol . Non-adherence to nighttime dosing due to low diastolic pressure. - Advised nighttime dosing of carvedilol . - Follow low sodium diet.  - Increase daily activity.

## 2023-12-10 NOTE — Patient Instructions (Signed)
 Health Maintenance, Male  Adopting a healthy lifestyle and getting preventive care are important in promoting health and wellness. Ask your health care provider about:  The right schedule for you to have regular tests and exams.  Things you can do on your own to prevent diseases and keep yourself healthy.  What should I know about diet, weight, and exercise?  Eat a healthy diet    Eat a diet that includes plenty of vegetables, fruits, low-fat dairy products, and lean protein.  Do not eat a lot of foods that are high in solid fats, added sugars, or sodium.  Maintain a healthy weight  Body mass index (BMI) is a measurement that can be used to identify possible weight problems. It estimates body fat based on height and weight. Your health care provider can help determine your BMI and help you achieve or maintain a healthy weight.  Get regular exercise  Get regular exercise. This is one of the most important things you can do for your health. Most adults should:  Exercise for at least 150 minutes each week. The exercise should increase your heart rate and make you sweat (moderate-intensity exercise).  Do strengthening exercises at least twice a week. This is in addition to the moderate-intensity exercise.  Spend less time sitting. Even light physical activity can be beneficial.  Watch cholesterol and blood lipids  Have your blood tested for lipids and cholesterol at 68 years of age, then have this test every 5 years.  You may need to have your cholesterol levels checked more often if:  Your lipid or cholesterol levels are high.  You are older than 68 years of age.  You are at high risk for heart disease.  What should I know about cancer screening?  Many types of cancers can be detected early and may often be prevented. Depending on your health history and family history, you may need to have cancer screening at various ages. This may include screening for:  Colorectal cancer.  Prostate cancer.  Skin cancer.  Lung  cancer.  What should I know about heart disease, diabetes, and high blood pressure?  Blood pressure and heart disease  High blood pressure causes heart disease and increases the risk of stroke. This is more likely to develop in people who have high blood pressure readings or are overweight.  Talk with your health care provider about your target blood pressure readings.  Have your blood pressure checked:  Every 3-5 years if you are 24-52 years of age.  Every year if you are 3 years old or older.  If you are between the ages of 60 and 72 and are a current or former smoker, ask your health care provider if you should have a one-time screening for abdominal aortic aneurysm (AAA).  Diabetes  Have regular diabetes screenings. This checks your fasting blood sugar level. Have the screening done:  Once every three years after age 66 if you are at a normal weight and have a low risk for diabetes.  More often and at a younger age if you are overweight or have a high risk for diabetes.  What should I know about preventing infection?  Hepatitis B  If you have a higher risk for hepatitis B, you should be screened for this virus. Talk with your health care provider to find out if you are at risk for hepatitis B infection.  Hepatitis C  Blood testing is recommended for:  Everyone born from [birth year] through 1965.  Anyone  with known risk factors for hepatitis C.  Sexually transmitted infections (STIs)  You should be screened each year for STIs, including gonorrhea and chlamydia, if:  You are sexually active and are younger than 68 years of age.  You are older than 68 years of age and your health care provider tells you that you are at risk for this type of infection.  Your sexual activity has changed since you were last screened, and you are at increased risk for chlamydia or gonorrhea. Ask your health care provider if you are at risk.  Ask your health care provider about whether you are at high risk for HIV. Your health care provider  may recommend a prescription medicine to help prevent HIV infection. If you choose to take medicine to prevent HIV, you should first get tested for HIV. You should then be tested every 3 months for as long as you are taking the medicine.  Follow these instructions at home:  Alcohol use  Do not drink alcohol if your health care provider tells you not to drink.  If you drink alcohol:  Limit how much you have to 0-2 drinks a day.  Know how much alcohol is in your drink. In the [place] one drink equals one 12 oz bottle of beer (355 mL), one 5 oz glass of wine (148 mL), or one 1 oz glass of hard liquor (44 mL).  Lifestyle  Do not use any products that contain nicotine or tobacco. These products include cigarettes, chewing tobacco, and vaping devices, such as e-cigarettes. If you need help quitting, ask your health care provider.  Do not use street drugs.  Do not share needles.  Ask your health care provider for help if you need support or information about quitting drugs.  General instructions  Schedule regular health, dental, and eye exams.  Stay current with your vaccines.  Tell your health care provider if:  You often feel depressed.  You have ever been abused or do not feel safe at home.  Summary  Adopting a healthy lifestyle and getting preventive care are important in promoting health and wellness.  Follow your health care provider's instructions about healthy diet, exercising, and getting tested or screened for diseases.  Follow your health care provider's instructions on monitoring your cholesterol and blood pressure.  This information is not intended to replace advice given to you by your health care provider. Make sure you discuss any questions you have with your health care provider.  Document Revised: 05/17/2020 Document Reviewed: 05/17/2020  Elsevier Patient Education  2024 [employer].

## 2023-12-10 NOTE — Assessment & Plan Note (Signed)
 A full exam was performed.  DRE performed, stool is heme negative.  He is advised to get 30-45 minutes of regular exercise, no less than four to five days per week. Both weight-bearing and aerobic exercises are recommended.  He is advised to follow a healthy diet with at least six fruits/veggies per day, decrease intake of red meat and other saturated fats and to increase fish intake to twice weekly.  Meats/fish should not be fried -- baked, boiled or broiled is preferable. It is also important to cut back on your sugar intake.  Be sure to read labels - try to avoid anything with added sugar, high fructose corn syrup or other sweeteners.  If you must use a sweetener, you can try stevia or monkfruit.  It is also important to avoid artificially sweetened foods/beverages and diet drinks. Lastly, wear SPF 50 sunscreen on exposed skin and when in direct sunlight for an extended period of time.  Be sure to avoid fast food restaurants and aim for at least 60 ounces of water daily.

## 2023-12-10 NOTE — Assessment & Plan Note (Signed)
 Wakes once per night to urinate without hematuria or other symptoms.

## 2023-12-10 NOTE — Assessment & Plan Note (Signed)
 Previous labs reviewed, his A1c has been elevated in the past. I will check an A1c today. Reminded to avoid refined sugars including sugary drinks/foods and processed meats including bacon, sausages and deli meats.

## 2023-12-10 NOTE — Assessment & Plan Note (Signed)
 BMI 36 w/ HTN. He is encouraged to strive for BMI less than 30 to decrease cardiac risk. Advised to aim for at least 150 minutes of exercise per week.

## 2023-12-11 ENCOUNTER — Ambulatory Visit: Payer: Self-pay | Admitting: Internal Medicine

## 2023-12-12 ENCOUNTER — Ambulatory Visit: Payer: Self-pay | Admitting: Internal Medicine

## 2023-12-12 LAB — CMP14+LP+TP+TSH+5AC+CBC/D/P...
ALT: 29 IU/L (ref 0–44)
AST: 22 IU/L (ref 0–40)
Albumin: 4.4 g/dL (ref 3.9–4.9)
Alkaline Phosphatase: 77 IU/L (ref 47–123)
BUN/Creatinine Ratio: 16 (ref 10–24)
BUN: 17 mg/dL (ref 8–27)
Basophils Absolute: 0 x10E3/uL (ref 0.0–0.2)
Basos: 0 %
Bilirubin Total: 0.6 mg/dL (ref 0.0–1.2)
Bilirubin, Direct: 0.21 mg/dL (ref 0.00–0.40)
Bilirubin, Indirect: 0.39 mg/dL (ref 0.10–0.80)
CO2: 22 mmol/L (ref 20–29)
Calcium: 9.4 mg/dL (ref 8.6–10.2)
Chloride: 103 mmol/L (ref 96–106)
Cholesterol, Total: 177 mg/dL (ref 100–199)
Creatinine, Ser: 1.05 mg/dL (ref 0.76–1.27)
EOS (ABSOLUTE): 0.3 x10E3/uL (ref 0.0–0.4)
Eos: 6 %
Free Thyroxine Index: 2.1 (ref 1.2–4.9)
Globulin, Total: 2.6 g/dL (ref 1.5–4.5)
Glucose: 122 mg/dL — ABNORMAL HIGH (ref 70–99)
HDL: 43 mg/dL (ref >39)
Hematocrit: 42 % (ref 37.5–51.0)
Hemoglobin: 14.2 g/dL (ref 13.0–17.7)
Immature Grans (Abs): 0 x10E3/uL (ref 0.0–0.1)
Immature Granulocytes: 0 %
LDH: 175 IU/L (ref 121–224)
LDL Chol Calc (NIH): 91 mg/dL (ref 0–99)
LDL Direct: 99 mg/dL (ref 0–99)
LDL/HDL Ratio: 2.1 ratio (ref 0.0–3.6)
Lymphocytes Absolute: 2.3 x10E3/uL (ref 0.7–3.1)
Lymphs: 38 %
MCH: 30.9 pg (ref 26.6–33.0)
MCHC: 33.8 g/dL (ref 31.5–35.7)
MCV: 92 fL (ref 79–97)
Monocytes Absolute: 0.6 x10E3/uL (ref 0.1–0.9)
Monocytes: 9 %
Neutrophils Absolute: 2.9 x10E3/uL (ref 1.4–7.0)
Neutrophils: 46 %
Phosphorus: 3.7 mg/dL (ref 2.8–4.1)
Platelets: 243 x10E3/uL (ref 150–450)
Potassium: 3.7 mmol/L (ref 3.5–5.2)
RBC: 4.59 x10E6/uL (ref 4.14–5.80)
RDW: 13.4 % (ref 11.6–15.4)
Sodium: 140 mmol/L (ref 134–144)
T3 Uptake Ratio: 27 % (ref 24–39)
T4, Total: 7.9 ug/dL (ref 4.5–12.0)
TSH: 1.4 u[IU]/mL (ref 0.450–4.500)
Total CK: 228 U/L (ref 41–331)
Total Protein: 7 g/dL (ref 6.0–8.5)
Triglycerides: 254 mg/dL — ABNORMAL HIGH (ref 0–149)
Uric Acid: 5.6 mg/dL (ref 3.8–8.4)
VLDL Cholesterol Cal: 43 mg/dL — ABNORMAL HIGH (ref 5–40)
WBC: 6.2 x10E3/uL (ref 3.4–10.8)
eGFR: 77 mL/min/1.73 (ref >59)

## 2023-12-12 LAB — HEMOGLOBIN A1C
Est. average glucose Bld gHb Est-mCnc: 131 mg/dL
Hgb A1c MFr Bld: 6.2 % — ABNORMAL HIGH (ref 4.8–5.6)

## 2023-12-12 LAB — MICROALBUMIN / CREATININE URINE RATIO
Creatinine, Urine: 208.8 mg/dL
Microalb/Creat Ratio: 2 mg/g{creat} (ref 0–29)
Microalbumin, Urine: 4.4 ug/mL

## 2023-12-12 LAB — LIPID PANEL: Chol/HDL Ratio: 4.1 ratio (ref 0.0–5.0)

## 2023-12-12 LAB — SPECIMEN STATUS REPORT

## 2023-12-12 LAB — PSA: Prostate Specific Ag, Serum: 0.8 ng/mL (ref 0.0–4.0)

## 2023-12-14 ENCOUNTER — Ambulatory Visit: Payer: Self-pay

## 2023-12-14 VITALS — Ht 65.0 in | Wt 219.0 lb

## 2023-12-14 DIAGNOSIS — Z0000 Encounter for general adult medical examination without abnormal findings: Secondary | ICD-10-CM

## 2023-12-14 NOTE — Progress Notes (Signed)
 Chief Complaint  Patient presents with   medicare annual wellness     Subjective:   Jerome Pacheco is a 68 y.o. male who presents for a Medicare Annual Wellness Visit.  Fall Screening Falls in the past year?: 0 Number of falls in past year: 0 Was there an injury with Fall?: 0 Fall Risk Category Calculator: 0 Patient Fall Risk Level: Low Fall Risk  Fall Risk Patient at Risk for Falls Due to: No Fall Risks Fall risk Follow up: Falls evaluation completed  Cognitive Assessment What year is it?: 0 points What month is it?: 0 points Give patient an address phrase to remember (5 components): [name] had a little lamb that followed her everywhere. About what time is it?: 0 points Count backwards from 20 to 1: 0 points Say the months of the year in reverse: 0 points Repeat the address phrase from earlier: 0 points 6 CIT Score: 0 points    Allergies (verified) Patient has no known allergies.   Current Medications (verified) Outpatient Encounter Medications as of 12/14/2023  Medication Sig   allopurinol  (ZYLOPRIM ) 100 MG tablet Take 1 tablet (100 mg total) by mouth daily.   carvedilol  (COREG ) 12.5 MG tablet Take 1 tablet (12.5 mg total) by mouth 2 (two) times daily with a meal.   colchicine  0.6 MG tablet Take 1 tablet (0.6 mg total) by mouth daily as needed.   ibuprofen  (ADVIL ) 600 MG tablet Take 1 tablet (600 mg total) by mouth every 6 (six) hours as needed.   Olmesartan -amLODIPine -HCTZ 40-10-25 MG TABS Take 1 tablet by mouth every morning.   spironolactone  (ALDACTONE ) 50 MG tablet Take 1 tablet (50 mg total) by mouth daily.   No facility-administered encounter medications on file as of 12/14/2023.    History: Past Medical History:  Diagnosis Date   Hypertension    Sleep apnea    Past Surgical History:  Procedure Laterality Date   APPENDECTOMY  1993   CHOLECYSTECTOMY  2010   Family History  Problem Relation Age of Onset   Heart disease Father    Cancer Mother     Social History   Occupational History   Occupation: Agricultural Engineer: [employer]  Tobacco Use   Smoking status: Never   Smokeless tobacco: Never  Vaping Use   Vaping status: Never Used  Substance and Sexual Activity   Alcohol use: No    Comment: quit 2012   Drug use: No   Sexual activity: Not on file   Tobacco Counseling Counseling given: Not Answered  SDOH Screenings   Food Insecurity: No Food Insecurity (12/14/2023)  Housing: Unknown (12/14/2023)  Transportation Needs: No Transportation Needs (12/14/2023)  Utilities: Not At Risk (12/14/2023)  Alcohol Screen: Low Risk  (12/14/2023)  Depression (PHQ2-9): Low Risk  (12/14/2023)  Financial Resource Strain: Low Risk  (12/14/2023)  Physical Activity: Sufficiently Active (12/14/2023)  Social Connections: Moderately Isolated (12/14/2023)  Stress: No Stress Concern Present (12/14/2023)  Tobacco Use: Low Risk  (12/14/2023)  Health Literacy: Adequate Health Literacy (12/14/2023)   See flowsheets for full screening details  Depression Screen PHQ 2 & 9 Depression Scale- Over the past 2 weeks, how often have you been bothered by any of the following problems? Little interest or pleasure in doing things: 0 Feeling down, depressed, or hopeless (PHQ Adolescent also includes...irritable): 0 PHQ-2 Total Score: 0 Trouble falling or staying asleep, or sleeping too much: 0 Feeling tired or having little energy: 0 Poor appetite or overeating (PHQ Adolescent also  includes...weight loss): 0 Feeling bad about yourself - or that you are a failure or have let yourself or your family down: 0 Trouble concentrating on things, such as reading the newspaper or watching television (PHQ Adolescent also includes...like school work): 0 Moving or speaking so slowly that other people could have noticed. Or the opposite - being so fidgety or restless that you have been moving around a lot more than usual: 0 Thoughts that you would be better off dead, or  of hurting yourself in some way: 0 PHQ-9 Total Score: 0 If you checked off any problems, how difficult have these problems made it for you to do your work, take care of things at home, or get along with other people?: Not difficult at all     Goals Addressed   None          Objective:    Today's Vitals   12/14/23 1015  Weight: 219 lb (99.3 kg)  Height: 5' 5 (1.651 m)   Body mass index is 36.44 kg/m.  Hearing/Vision screen No results found. Immunizations and Health Maintenance Health Maintenance  Topic Date Due   COVID-19 Vaccine (8 - 2025-26 season) 09/10/2023   Medicare Annual Wellness (AWV)  12/13/2024   Colonoscopy  01/30/2028   DTaP/Tdap/Td (2 - Td or Tdap) 08/30/2031   Pneumococcal Vaccine: 50+ Years  Completed   Influenza Vaccine  Completed   Hepatitis C Screening  Completed   Zoster Vaccines- Shingrix  Completed   Meningococcal B Vaccine  Aged Out        Assessment/Plan:  This is a routine wellness examination for Jerome Pacheco.  Patient Care Team: Jarold Medici, MD as PCP - General (Internal Medicine)  I have personally reviewed and noted the following in the patient's chart:   Medical and social history Use of alcohol, tobacco or illicit drugs  Current medications and supplements including opioid prescriptions. Functional ability and status Nutritional status Physical activity Advanced directives List of other physicians Hospitalizations, surgeries, and ER visits in previous 12 months Vitals Screenings to include cognitive, depression, and falls Referrals and appointments  No orders of the defined types were placed in this encounter.  In addition, I have reviewed and discussed with patient certain preventive protocols, quality metrics, and best practice recommendations. A written personalized care plan for preventive services as well as general preventive health recommendations were provided to patient.   Jerome Pacheco, CMA   12/14/2023    Return in 1 year (on 12/13/2024).  After Visit Summary: (Mail) Due to this being a telephonic visit, the after visit summary with patients personalized plan was offered to patient via mail   Nurse Notes: n/a

## 2024-01-25 NOTE — Patient Instructions (Signed)
 I connected with  Jerome Pacheco on 01/25/24 by a audio enabled telemedicine application and verified that I am speaking with the correct person using two identifiers.  Patient Location: Home  Provider Location: Office/Clinic  Persons Participating in Visit: Patient.  I discussed the limitations of evaluation and management by telemedicine. The patient expressed understanding and agreed to proceed.   Vital Signs: Because this visit was a virtual/telehealth visit, some criteria may be missing or patient reported. Any vitals not documented were not able to be obtained and vitals that have been documented are patient reported.

## 2024-02-13 NOTE — Addendum Note (Signed)
 Addended by: GLADIS KRISTEEN PARAS on: 02/13/2024 01:07 PM   Modules accepted: Orders

## 2024-02-13 NOTE — Progress Notes (Addendum)
 "  Chief Complaint  Patient presents with   medicare annual wellness     Subjective:   Jerome Pacheco is a 69 y.o. male who presents for a Medicare Annual Wellness Visit.  Visit info / Clinical Intake: Medicare Wellness Visit Type:: Subsequent Annual Wellness Visit Persons participating in visit and providing information:: patient Medicare Wellness Visit Mode:: Telephone If telephone:: video declined Since this visit was completed virtually, some vitals may be partially provided or unavailable. Missing vitals are due to the limitations of the virtual format.: Unable to obtain vitals - no equipment If Telephone or Video please confirm:: I connected with patient using audio/video enable telemedicine. I verified patient identity with two identifiers, discussed telehealth limitations, and patient agreed to proceed. Patient Location:: Home Provider Location:: Office Interpreter Needed?: No Pre-visit prep was completed: yes AWV questionnaire completed by patient prior to visit?: no Living arrangements:: lives with spouse/significant other Patient's Overall Health Status Rating: very good Typical amount of pain: none Does pain affect daily life?: no Are you currently prescribed opioids?: no  Dietary Habits and Nutritional Risks How many meals a day?: 3 Eats fruit and vegetables daily?: yes Most meals are obtained by: preparing own meals In the last 2 weeks, have you had any of the following?: none Diabetic:: no  Functional Status Activities of Daily Living (to include ambulation/medication): Independent Ambulation: Independent Medication Administration: Independent Home Management (perform basic housework or laundry): Independent Manage your own finances?: yes Primary transportation is: driving Concerns about hearing?: no  Fall Screening Falls in the past year?: 0 Number of falls in past year: 0 Was there an injury with Fall?: 0 Fall Risk Category Calculator: 0 Patient Fall  Risk Level: Low Fall Risk  Fall Risk Patient at Risk for Falls Due to: No Fall Risks Fall risk Follow up: Falls evaluation completed  Home and Transportation Safety: All rugs have non-skid backing?: yes All stairs or steps have railings?: yes Grab bars in the bathtub or shower?: yes Have non-skid surface in bathtub or shower?: yes Good home lighting?: yes Regular seat belt use?: yes Hospital stays in the last year:: no  Cognitive Assessment Difficulty concentrating, remembering, or making decisions? : no Will 6CIT or Mini Cog be Completed: yes What year is it?: 0 points What month is it?: 0 points Give patient an address phrase to remember (5 components): [name] had a Little lamb that followed her everywhere. About what time is it?: 0 points Count backwards from 20 to 1: 0 points Say the months of the year in reverse: 0 points Repeat the address phrase from earlier: 0 points 6 CIT Score: 0 points  Advance Directives (For Healthcare) Does Patient Have a Medical Advance Directive?: Yes Does patient want to make changes to medical advance directive?: Yes (ED - send information to MyChart)  Reviewed/Updated  Reviewed/Updated: Reviewed All (Medical, Surgical, Family, Medications, Allergies, Care Teams, Patient Goals); Medical History; Surgical History; Family History; Medications; Allergies; Patient Goals; Care Teams    Allergies (verified) Patient has no known allergies.   Current Medications (verified) Outpatient Encounter Medications as of 12/14/2023  Medication Sig   allopurinol  (ZYLOPRIM ) 100 MG tablet Take 1 tablet (100 mg total) by mouth daily.   carvedilol  (COREG ) 12.5 MG tablet Take 1 tablet (12.5 mg total) by mouth 2 (two) times daily with a meal.   colchicine  0.6 MG tablet Take 1 tablet (0.6 mg total) by mouth daily as needed.   ibuprofen  (ADVIL ) 600 MG tablet Take 1 tablet (600 mg total)  by mouth every 6 (six) hours as needed.   Olmesartan -amLODIPine -HCTZ 40-10-25 MG  TABS Take 1 tablet by mouth every morning.   spironolactone  (ALDACTONE ) 50 MG tablet Take 1 tablet (50 mg total) by mouth daily.   No facility-administered encounter medications on file as of 12/14/2023.    History: Past Medical History:  Diagnosis Date   Hypertension    Sleep apnea    Past Surgical History:  Procedure Laterality Date   APPENDECTOMY  1993   CHOLECYSTECTOMY  2010   Family History  Problem Relation Age of Onset   Heart disease Father    Cancer Mother    Social History   Occupational History   Occupation: Agricultural Engineer: [employer]  Tobacco Use   Smoking status: Never   Smokeless tobacco: Never  Vaping Use   Vaping status: Never Used  Substance and Sexual Activity   Alcohol use: No    Comment: quit 2012   Drug use: No   Sexual activity: Not on file   Tobacco Counseling Counseling given: Not Answered  SDOH Screenings   Food Insecurity: No Food Insecurity (12/14/2023)  Housing: Unknown (12/14/2023)  Transportation Needs: No Transportation Needs (12/14/2023)  Utilities: Not At Risk (12/14/2023)  Alcohol Screen: Low Risk (12/14/2023)  Depression (PHQ2-9): Low Risk (12/14/2023)  Financial Resource Strain: Low Risk (12/14/2023)  Physical Activity: Sufficiently Active (12/14/2023)  Social Connections: Moderately Isolated (12/14/2023)  Stress: No Stress Concern Present (12/14/2023)  Tobacco Use: Low Risk (12/14/2023)  Health Literacy: Adequate Health Literacy (12/14/2023)   See flowsheets for full screening details  Depression Screen PHQ 2 & 9 Depression Scale- Over the past 2 weeks, how often have you been bothered by any of the following problems? Little interest or pleasure in doing things: 0 Feeling down, depressed, or hopeless (PHQ Adolescent also includes...irritable): 0 PHQ-2 Total Score: 0 Trouble falling or staying asleep, or sleeping too much: 0 Feeling tired or having little energy: 0 Poor appetite or overeating (PHQ Adolescent also  includes...weight loss): 0 Feeling bad about yourself - or that you are a failure or have let yourself or your family down: 0 Trouble concentrating on things, such as reading the newspaper or watching television (PHQ Adolescent also includes...like school work): 0 Moving or speaking so slowly that other people could have noticed. Or the opposite - being so fidgety or restless that you have been moving around a lot more than usual: 0 Thoughts that you would be better off dead, or of hurting yourself in some way: 0 PHQ-9 Total Score: 0 If you checked off any problems, how difficult have these problems made it for you to do your work, take care of things at home, or get along with other people?: Not difficult at all     Goals Addressed   None          Objective:    Today's Vitals   12/14/23 1015  Weight: 219 lb (99.3 kg)  Height: 5' 5 (1.651 m)   Body mass index is 36.44 kg/m.  Hearing/Vision screen No results found. Immunizations and Health Maintenance Health Maintenance  Topic Date Due   COVID-19 Vaccine (7 - 2025-26 season) 09/10/2023   Medicare Annual Wellness (AWV)  12/13/2024   Colonoscopy  01/30/2028   DTaP/Tdap/Td (2 - Td or Tdap) 08/30/2031   Pneumococcal Vaccine: 50+ Years  Completed   Influenza Vaccine  Completed   Hepatitis C Screening  Completed   Zoster Vaccines- Shingrix  Completed   Meningococcal  B Vaccine  Aged Out        Assessment/Plan:  This is a routine wellness examination for Jerome Pacheco.  Patient Care Team: Jarold Medici, MD as PCP - General (Internal Medicine)  I have personally reviewed and noted the following in the patients chart:   Medical and social history Use of alcohol, tobacco or illicit drugs  Current medications and supplements including opioid prescriptions. Functional ability and status Nutritional status Physical activity Advanced directives List of other physicians Hospitalizations, surgeries, and ER visits in previous 12  months Vitals Screenings to include cognitive, depression, and falls Referrals and appointments  No orders of the defined types were placed in this encounter.  In addition, I have reviewed and discussed with patient certain preventive protocols, quality metrics, and best practice recommendations. A written personalized care plan for preventive services as well as general preventive health recommendations were provided to patient.   Kristeen JINNY Lunger, CMA   12/14/2023  Return in 1 year (on 12/13/2024).  After Visit Summary: (MyChart) Due to this being a telephonic visit, the after visit summary with patients personalized plan was offered to patient via MyChart   Nurse Notes:  "

## 2024-06-09 ENCOUNTER — Ambulatory Visit: Admitting: Internal Medicine

## 2024-12-15 ENCOUNTER — Encounter: Admitting: Internal Medicine
